# Patient Record
Sex: Male | Born: 1990
Health system: Southern US, Community
[De-identification: ages and names within clinical notes are randomized; demographics above are authoritative.]

## PROBLEM LIST (undated history)

## (undated) HISTORY — PX: COSMETIC SURGERY: SHX468

---

## 2012-12-07 ENCOUNTER — Ambulatory Visit (INDEPENDENT_AMBULATORY_CARE_PROVIDER_SITE_OTHER): Payer: BC Managed Care – PPO | Admitting: Emergency Medicine

## 2012-12-07 DIAGNOSIS — S61011A Laceration without foreign body of right thumb without damage to nail, initial encounter: Secondary | ICD-10-CM

## 2012-12-07 DIAGNOSIS — H00016 Hordeolum externum left eye, unspecified eyelid: Secondary | ICD-10-CM

## 2012-12-07 DIAGNOSIS — S61209A Unspecified open wound of unspecified finger without damage to nail, initial encounter: Secondary | ICD-10-CM

## 2012-12-07 DIAGNOSIS — H00019 Hordeolum externum unspecified eye, unspecified eyelid: Secondary | ICD-10-CM

## 2012-12-07 MED ORDER — OFLOXACIN 0.3 % OP SOLN: 1.0000 [drp] | Freq: Four times a day (QID) | OPHTHALMIC | Status: DC
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
## 2012-12-07 NOTE — Patient Instructions (Signed)
Sty A sty (hordeolum) is an infection of a gland in the eyelid located at the base of the eyelash. A sty may develop a white or yellow head of pus. It can be puffy (swollen). Usually, the sty will burst and pus will come out on its own. They do not leave lumps in the eyelid once they drain. A sty is often confused with another form of cyst of the eyelid called a chalazion. Chalazions occur within the eyelid and not on the edge where the bases of the eyelashes are. They often are red, sore and then form firm lumps in the eyelid. CAUSES   Germs (bacteria).  Lasting (chronic) eyelid inflammation. SYMPTOMS   Tenderness, redness and swelling along the edge of the eyelid at the base of the eyelashes.  Sometimes, there is a white or yellow head of pus. It may or may not drain. DIAGNOSIS  An ophthalmologist will be able to distinguish between a sty and a chalazion and treat the condition appropriately.  TREATMENT   Styes are typically treated with warm packs (compresses) until drainage occurs.  In rare cases, medicines that kill germs (antibiotics) may be prescribed. These antibiotics may be in the form of drops, cream or pills.  If a hard lump has formed, it is generally necessary to do a small incision and remove the hardened contents of the cyst in a minor surgical procedure done in the office.  In suspicious cases, your caregiver may send the contents of the cyst to the lab to be certain that it is not a rare, but dangerous form of cancer of the glands of the eyelid. HOME CARE INSTRUCTIONS   Wash your hands often and dry them with a clean towel. Avoid touching your eyelid. This may spread the infection to other parts of the eye.  Apply heat to your eyelid for 10 to 20 minutes, several times a day, to ease pain and help to heal it faster.  Do not squeeze the sty. Allow it to drain on its own. Wash your eyelid carefully 3 to 4 times per day to remove any pus. SEEK IMMEDIATE MEDICAL CARE IF:    Your eye becomes painful or puffy (swollen).  Your vision changes.  Your sty does not drain by itself within 3 days.  Your sty comes back within a short period of time, even with treatment.  You have redness (inflammation) around the eye.  You have a fever. Document Released: 10/12/2004 Document Revised: 03/27/2011 Document Reviewed: 06/16/2008 ExitCare Patient Information 2014 [employer].  

## 2013-01-25 ENCOUNTER — Ambulatory Visit (INDEPENDENT_AMBULATORY_CARE_PROVIDER_SITE_OTHER): Payer: BC Managed Care – PPO | Admitting: Physician Assistant

## 2013-01-25 VITALS — BP 110/62 | HR 86 | Temp 98.7°F | Resp 18 | Ht 70.0 in | Wt 128.0 lb

## 2013-01-25 DIAGNOSIS — R0981 Nasal congestion: Secondary | ICD-10-CM

## 2013-01-25 DIAGNOSIS — J019 Acute sinusitis, unspecified: Secondary | ICD-10-CM

## 2013-01-25 DIAGNOSIS — J3489 Other specified disorders of nose and nasal sinuses: Secondary | ICD-10-CM

## 2013-01-25 MED ORDER — AMOXICILLIN 875 MG PO TABS: 875.0000 mg | ORAL_TABLET | Freq: Two times a day (BID) | ORAL | Status: DC

## 2013-01-25 MED ORDER — IPRATROPIUM BROMIDE 0.03 % NA SOLN: 2.0000 | Freq: Two times a day (BID) | NASAL | Status: DC

## 2013-01-25 NOTE — Progress Notes (Signed)
   Subjective:    Patient ID: Adrian Johnson, male    DOB: [birth year], 23 y.o.   MRN: [id number]  HPI 23 year old male presents for evaluation of 3 day history of nasal congestion, PND, slight nonproductive cough, and bilateral ear pressure. States symptoms have been progressively worsening and today his job sent him home since he was sick.  Denies fever, chills, nausea, vomiting, sore throat, body aches, sinus pain, or dizziness. Admits that he has had mostly clear, thin rhinorrhea.  Has taken OTC Dayquil/Nyquil which has not helped much.   Patient is otherwise doing well with no other concerns today.     Review of Systems  Constitutional: Negative for fever and chills.  HENT: Positive for congestion, postnasal drip and sinus pressure. Negative for ear pain (bilateral pressure) and sore throat.   Respiratory: Positive for cough. Negative for chest tightness, shortness of breath and wheezing.   Cardiovascular: Negative for chest pain.  Gastrointestinal: Negative for nausea, vomiting and abdominal pain.  Neurological: Negative for headaches.       Objective:   Physical Exam  Constitutional: He is oriented to person, place, and time. He appears well-developed and well-nourished.  HENT:  Head: Normocephalic and atraumatic.  Right Ear: Hearing, tympanic membrane, external ear and ear canal normal.  Left Ear: Hearing, tympanic membrane, external ear and ear canal normal.  Mouth/Throat: Uvula is midline, oropharynx is clear and moist and mucous membranes are normal.  Eyes: Conjunctivae are normal.  Neck: Normal range of motion. Neck supple.  Cardiovascular: Normal rate, regular rhythm and normal heart sounds.   Pulmonary/Chest: Effort normal and breath sounds normal.  Lymphadenopathy:    He has no cervical adenopathy.  Neurological: He is alert and oriented to person, place, and time.  Psychiatric: He has a normal mood and affect. His behavior is normal. Judgment and thought content  normal.          Assessment & Plan:  Sinusitis, acute - Plan: amoxicillin (AMOXIL) 875 MG tablet  Nasal congestion - Plan: ipratropium (ATROVENT) 0.03 % nasal spray  Will go ahead and cover with amoxicillin 875 mg bid x 10 days Atrovent NS twice daily to help with ETD and congestion Increase fluids and rest Nyquil at bedtime to help with coughing.  Follow up if symptoms worsen or fail to imporve.

## 2013-04-26 ENCOUNTER — Ambulatory Visit (INDEPENDENT_AMBULATORY_CARE_PROVIDER_SITE_OTHER): Payer: BC Managed Care – PPO | Admitting: Physician Assistant

## 2013-04-26 VITALS — BP 112/60 | HR 85 | Temp 98.0°F | Resp 18 | Ht 70.5 in | Wt 132.6 lb

## 2013-04-26 DIAGNOSIS — J069 Acute upper respiratory infection, unspecified: Secondary | ICD-10-CM

## 2013-04-26 DIAGNOSIS — D72819 Decreased white blood cell count, unspecified: Secondary | ICD-10-CM

## 2013-04-26 DIAGNOSIS — R05 Cough: Secondary | ICD-10-CM

## 2013-04-26 DIAGNOSIS — J029 Acute pharyngitis, unspecified: Secondary | ICD-10-CM

## 2013-04-26 DIAGNOSIS — R059 Cough, unspecified: Secondary | ICD-10-CM

## 2013-04-26 LAB — POCT CBC
GRANULOCYTE PERCENT: 67.7 % (ref 37–80)
HCT, POC: 47.9 % (ref 43.5–53.7)
HEMOGLOBIN: 15 g/dL (ref 14.1–18.1)
Lymph, poc: 0.7 (ref 0.6–3.4)
MCH, POC: 28.4 pg (ref 27–31.2)
MCHC: 31.3 g/dL — AB (ref 31.8–35.4)
MCV: 90.6 fL (ref 80–97)
MID (CBC): 0.3 (ref 0–0.9)
MPV: 8.6 fL (ref 0–99.8)
POC Granulocyte: 2.1 (ref 2–6.9)
POC LYMPH PERCENT: 22.2 %L (ref 10–50)
POC MID %: 10.1 %M (ref 0–12)
Platelet Count, POC: 198 10*3/uL (ref 142–424)
RBC: 5.29 M/uL (ref 4.69–6.13)
RDW, POC: 15.4 %
WBC: 3.1 10*3/uL — AB (ref 4.6–10.2)

## 2013-04-26 LAB — POCT INFLUENZA A/B
INFLUENZA B, POC: NEGATIVE
Influenza A, POC: NEGATIVE

## 2013-04-26 LAB — POCT RAPID STREP A (OFFICE): RAPID STREP A SCREEN: NEGATIVE

## 2013-04-26 MED ORDER — AMOXICILLIN 500 MG PO CAPS: 500.0000 mg | ORAL_CAPSULE | Freq: Three times a day (TID) | ORAL | Status: DC

## 2013-04-26 MED ORDER — HYDROCODONE-HOMATROPINE 5-1.5 MG/5ML PO SYRP: ORAL_SOLUTION | ORAL | Status: DC

## 2013-04-26 NOTE — Progress Notes (Signed)
Subjective:    Patient ID: Adrian Johnson, maleNicholes Stairs    DOB: October 04, 1990, 23 y.o.   MRN: [id number]  HPI Primary Physician: No PCP Per Patient  Chief Complaint: ST and fever x 2-3 days  HPI: 23 y.o. male with history below presents with 2-3 day history of ST, congestion, post nasal drip, sinus pressure, sneezing, cough, headache, and fever. Cough is mildly productive of yellow sputum and worse in the morning. No SOB or wheezing. Has nasal congestion at baseline. Has a frontal sinus headache. Has some muffled hearing. States his ears feel full.   Some sick coworkers, otherwise no known sick contacts.   Of note, patient also used a Q tip to remove a tonsolith from the left side of his tonsil 2-3 days ago after trying to remove this by gargling and being unsuccessful. He states he was feeling like he was coming down with something prior to removing the tonsolith.    History reviewed. No pertinent past medical history.   Home Meds: Prior to Admission medications   Medication Sig Start Date End Date Taking? Authorizing Provider  ipratropium (ATROVENT) 0.03 % nasal spray Place 2 sprays into the nose 2 (two) times daily. 01/25/13  Yes Heather M Marte, PA-C  ofloxacin (OCUFLOX) 0.3 % ophthalmic solution Place 1 drop into the left eye 4 (four) times daily. 12/07/12  No Phillips OdorJeffery Anderson, MD    Allergies: No Known Allergies  History   Social History  . Marital Status: Single    Spouse Name: N/A    Number of Children: N/A  . Years of Education: N/A   Occupational History  . Not on file.   Social History Main Topics  . Smoking status: Never Smoker   . Smokeless tobacco: Never Used  . Alcohol Use: Yes  . Drug Use: No  . Sexual Activity: Not on file   Other Topics Concern  . Not on file   Social History Narrative  . No narrative on file     Review of Systems  Constitutional: Positive for fever and fatigue. Negative for chills and appetite change.  HENT: Positive for congestion,  hearing loss, postnasal drip, sinus pressure, sneezing and sore throat. Negative for ear pain and rhinorrhea.        Nasal congestion at baseline.  Ears feel "stuffed"  Respiratory: Positive for cough. Negative for shortness of breath and wheezing.        Cough is productive of "a little mucus" Yellow. Cough is worse in the morning.   Gastrointestinal: Negative for nausea, vomiting and diarrhea.  Neurological: Positive for headaches.       Headache along the frontal sinus.        Objective:   Physical Exam  Physical Exam: Blood pressure 112/60, pulse 85, temperature 98 F (36.7 C), temperature source Oral, resp. rate 18, height 5' 10.5" (1.791 m), weight 132 lb 9.6 oz (60.147 kg), SpO2 98.00%., Body mass index is 18.75 kg/(m^2). General: Well developed, well nourished, in no acute distress. Head: Normocephalic, atraumatic, eyes without discharge, sclera non-icteric, nares are without discharge. Bilateral auditory canals clear, TM's are without perforation, pearly grey and translucent with reflective cone of light bilaterally. Oral cavity moist, posterior pharynx erythematous with post nasal drip. Site of previous tonsolith visible. No exudate or peritonsillar abscess. Uvula midline.   Neck: Supple. No thyromegaly. Full ROM. Lymph nodes: less than 2 cm AC bilaterally. Lungs: Clear bilaterally to auscultation without wheezes, rales, or rhonchi. Breathing is unlabored. Heart: RRR  with S1 S2. No murmurs, rubs, or gallops appreciated. Msk:  Strength and tone normal for age. Extremities/Skin: Warm and dry. No clubbing or cyanosis. No edema. No rashes or suspicious lesions. Neuro: Alert and oriented X 3. Moves all extremities spontaneously. Gait is normal. CNII-XII grossly in tact. Psych:  Responds to questions appropriately with a normal affect.    Labs: Results for orders placed in visit on 04/26/13  POCT RAPID STREP A (OFFICE)      Result Value Ref Range   Rapid Strep A Screen Negative   Negative  POCT CBC      Result Value Ref Range   WBC 3.1 (*) 4.6 - 10.2 K/uL   Lymph, poc 0.7  0.6 - 3.4   POC LYMPH PERCENT 22.2  10 - 50 %L   MID (cbc) 0.3  0 - 0.9   POC MID % 10.1  0 - 12 %M   POC Granulocyte 2.1  2 - 6.9   Granulocyte percent 67.7  37 - 80 %G   RBC 5.29  4.69 - 6.13 M/uL   Hemoglobin 15.0  14.1 - 18.1 g/dL   HCT, POC 37.1  06.2 - 53.7 %   MCV 90.6  80 - 97 fL   MCH, POC 28.4  27 - 31.2 pg   MCHC 31.3 (*) 31.8 - 35.4 g/dL   RDW, POC 69.4     Platelet Count, POC 198  142 - 424 K/uL   MPV 8.6  0 - 99.8 fL  POCT INFLUENZA A/B      Result Value Ref Range   Influenza A, POC Negative     Influenza B, POC Negative      Throat culture pending     Assessment & Plan:  23 year old male with URI, pharyngitis, cough, and leukopenia  -Amoxicillin 500 mg 1 po tid #30 no RF, cover for secondary infection given the probing to remove the tonsolith at home -Hycodan #4oz 1 tsp po q 4-6 hours prn cough no RF SED -Avoid clearing tonsoliths manually in the future. Left them self resolve. Discussed the risks.  -Rest/fluids -RTC precautions   Eula Listen, MHS, PA-C [facility] [address] [place], [place] [address] [phone] [facility] 04/26/2013 1:56 PM

## 2013-04-27 ENCOUNTER — Telehealth: Payer: Self-pay

## 2013-04-27 NOTE — Telephone Encounter (Signed)
PATIENT HAS A NOTE FOR WORK FOR Saturday-Sunday AND IS NOT FEELING BETTER, WANTS TO BE WRITTEN OUT FOR Monday AS WELL.

## 2013-04-28 ENCOUNTER — Telehealth: Payer: Self-pay

## 2013-04-28 LAB — CULTURE, GROUP A STREP: Organism ID, Bacteria: NORMAL

## 2013-04-28 NOTE — Telephone Encounter (Signed)
Pt did pick up a note for school. Pt states he wont be at school tomorrow and needs a note stating ok to out tomorrow, 04/29/13 Please call pt to advise

## 2013-04-28 NOTE — Telephone Encounter (Signed)
Wrote note for today, informed patient, placed up front for p/u.  Written to go back to work tomorrow.

## 2013-04-28 NOTE — Telephone Encounter (Signed)
Is this ok?

## 2013-04-29 NOTE — Telephone Encounter (Signed)
Ok for note. If continues to feel like he cannot go to class he will have to RTC.

## 2013-04-29 NOTE — Telephone Encounter (Signed)
Note written- in pu drawer. Notified pt.

## 2013-05-09 ENCOUNTER — Ambulatory Visit (INDEPENDENT_AMBULATORY_CARE_PROVIDER_SITE_OTHER): Payer: BC Managed Care – PPO | Admitting: Family Medicine

## 2013-05-09 VITALS — BP 122/74 | HR 93 | Temp 98.2°F | Resp 17 | Ht 70.0 in | Wt 129.0 lb

## 2013-05-09 DIAGNOSIS — J029 Acute pharyngitis, unspecified: Secondary | ICD-10-CM

## 2013-05-09 LAB — POCT CBC
Granulocyte percent: 25.5 %G — AB (ref 37–80)
HEMATOCRIT: 49.7 % (ref 43.5–53.7)
HEMOGLOBIN: 15.8 g/dL (ref 14.1–18.1)
LYMPH, POC: 7.5 — AB (ref 0.6–3.4)
MCH: 28.3 pg (ref 27–31.2)
MCHC: 31.8 g/dL (ref 31.8–35.4)
MCV: 89.1 fL (ref 80–97)
MID (cbc): 1.5 — AB (ref 0–0.9)
MPV: 8.4 fL (ref 0–99.8)
POC Granulocyte: 3.1 (ref 2–6.9)
POC LYMPH PERCENT: 61.8 %L — AB (ref 10–50)
POC MID %: 12.7 %M — AB (ref 0–12)
Platelet Count, POC: 210 10*3/uL (ref 142–424)
RBC: 5.58 M/uL (ref 4.69–6.13)
RDW, POC: 14.6 %
WBC: 12.2 10*3/uL — AB (ref 4.6–10.2)

## 2013-05-09 LAB — POCT RAPID STREP A (OFFICE): Rapid Strep A Screen: NEGATIVE

## 2013-05-09 MED ORDER — FIRST-DUKES MOUTHWASH MT SUSP: 5.0000 mL | OROMUCOSAL | Status: DC | PRN

## 2013-05-09 NOTE — Patient Instructions (Signed)
I have sent in a prescription for a throat gargle. You can use it every 2 hours as needed- swish and swallow or spit Add ibuprofen 600-800 mg every 8 hours for pain Add decongestant like sudafed (generic fine) for congestion.  Pharyngitis Pharyngitis is redness, pain, and swelling (inflammation) of your pharynx.  CAUSES  Pharyngitis is usually caused by infection. Most of the time, these infections are from viruses (viral) and are part of a cold. However, sometimes pharyngitis is caused by bacteria (bacterial). Pharyngitis can also be caused by allergies. Viral pharyngitis may be spread from person to person by coughing, sneezing, and personal items or utensils (cups, forks, spoons, toothbrushes). Bacterial pharyngitis may be spread from person to person by more intimate contact, such as kissing.  SIGNS AND SYMPTOMS  Symptoms of pharyngitis include:   Sore throat.   Tiredness (fatigue).   Low-grade fever.   Headache.  Joint pain and muscle aches.  Skin rashes.  Swollen lymph nodes.  Plaque-like film on throat or tonsils (often seen with bacterial pharyngitis). DIAGNOSIS  Your health care provider will ask you questions about your illness and your symptoms. Your medical history, along with a physical exam, is often all that is needed to diagnose pharyngitis. Sometimes, a rapid strep test is done. Other lab tests may also be done, depending on the suspected cause.  TREATMENT  Viral pharyngitis will usually get better in 3 4 days without the use of medicine. Bacterial pharyngitis is treated with medicines that kill germs (antibiotics).  HOME CARE INSTRUCTIONS   Drink enough water and fluids to keep your urine clear or pale yellow.   Only take over-the-counter or prescription medicines as directed by your health care provider:   If you are prescribed antibiotics, make sure you finish them even if you start to feel better.   Do not take aspirin.   Get lots of rest.    Gargle with 8 oz of salt water ( tsp of salt per 1 qt of water) as often as every 1 2 hours to soothe your throat.   Throat lozenges (if you are not at risk for choking) or sprays may be used to soothe your throat. SEEK MEDICAL CARE IF:   You have large, tender lumps in your neck.  You have a rash.  You cough up green, yellow-brown, or bloody spit. SEEK IMMEDIATE MEDICAL CARE IF:   Your neck becomes stiff.  You drool or are unable to swallow liquids.  You vomit or are unable to keep medicines or liquids down.  You have severe pain that does not go away with the use of recommended medicines.  You have trouble breathing (not caused by a stuffy nose). MAKE SURE YOU:   Understand these instructions.  Will watch your condition.  Will get help right away if you are not doing well or get worse. Document Released: 01/02/2005 Document Revised: 10/23/2012 Document Reviewed: 09/09/2012 [facility] Patient Information 2014 [place], [place].

## 2013-05-09 NOTE — Progress Notes (Signed)
I have discussed this case with Ms. Gessner, NP and agree.  

## 2013-05-09 NOTE — Progress Notes (Signed)
Subjective:    Patient ID: Adrian Johnson, male    DOB: 27-Jun-1990, 23 y.o.   MRN: [id number]  HPI Patient seen 13 days ago by Time Warneryan Dunn,PA-C. He was given antibiotics and cough syrup. He feels that his symptoms were completely resolved until yesterday when he started to have a sore throat again. The left side of his throat is more sore than the right. He removed a tonsilith from his left tonsil prior to his last office visit. Has been taking dayquil.Took a dose today with little relief. His CBC from prior visit showed leukopenia.  He has nasal congestion.Little nasal drainage, mostly post nasal drainage. Takes atrovent nasal spray and feels this is effective for his allergy symptoms.  Review of Systems No cough, no fever, has felt hot at night for last couple of weeks. No difficulty swallowing, just painful.    Objective:   Physical Exam  Vitals reviewed. Constitutional: He is oriented to person, place, and time. He appears well-developed and well-nourished.  HENT:  Head: Normocephalic and atraumatic.  Right Ear: Tympanic membrane, external ear and ear canal normal.  Left Ear: External ear and ear canal normal.  Nose: Mucosal edema present.  Mouth/Throat: Uvula is midline. Oropharyngeal exudate, posterior oropharyngeal edema and posterior oropharyngeal erythema present.  Left TM appears thickened and opaque. Left tonsil swollen, +2, no exudate.  Eyes: Conjunctivae are normal. Right eye exhibits no discharge. Left eye exhibits no discharge.  Neck: Normal range of motion. Neck supple.  Right submandibular lymph node, approx. 1 cm (patient reports this is old). Shoddy left anterior cervical nodes.  Cardiovascular: Normal rate, regular rhythm and normal heart sounds.   Pulmonary/Chest: Effort normal and breath sounds normal.  Musculoskeletal: Normal range of motion.  Neurological: He is alert and oriented to person, place, and time.  Skin: Skin is warm.  Psychiatric: He has a  normal mood and affect. His behavior is normal. Judgment and thought content normal.   Results for orders placed in visit on 05/09/13  POCT CBC      Result Value Ref Range   WBC 12.2 (*) 4.6 - 10.2 K/uL   Lymph, poc 7.5 (*) 0.6 - 3.4   POC LYMPH PERCENT 61.8 (*) 10 - 50 %L   MID (cbc) 1.5 (*) 0 - 0.9   POC MID % 12.7 (*) 0 - 12 %M   POC Granulocyte 3.1  2 - 6.9   Granulocyte percent 25.5 (*) 37 - 80 %G   RBC 5.58  4.69 - 6.13 M/uL   Hemoglobin 15.8  14.1 - 18.1 g/dL   HCT, POC 40.949.7  81.143.5 - 53.7 %   MCV 89.1  80 - 97 fL   MCH, POC 28.3  27 - 31.2 pg   MCHC 31.8  31.8 - 35.4 g/dL   RDW, POC 91.414.6     Platelet Count, POC 210  142 - 424 K/uL   MPV 8.4  0 - 99.8 fL  POCT RAPID STREP A (OFFICE)      Result Value Ref Range   Rapid Strep A Screen Negative  Negative   Patient given Ibuprofen 600 mg po in the clinic and reports improved symptoms.     Assessment & Plan:  1. Acute pharyngitis - POCT CBC - POCT rapid strep A - Culture, Group A Strep - Diphenhyd-Hydrocort-Nystatin (FIRST-DUKES MOUTHWASH) SUSP; Use as directed 5 mLs in the mouth or throat every 2 (two) hours as needed.  Dispense: 237 mL; Refill: 0 -ibuprofen  600-800 mg po q 8 hours as needed for pain -add decongestant prn  -follow up if worsening pain, fever or difficulty swallowing or breathing.   Emi Belfasteborah B. Gessner, FNP-BC  [facility]  05/09/2013 4:26 PM

## 2013-05-10 ENCOUNTER — Telehealth: Payer: Self-pay

## 2013-05-10 NOTE — Telephone Encounter (Signed)
Is this ok?

## 2013-05-10 NOTE — Telephone Encounter (Signed)
Yes. This is ok.

## 2013-05-10 NOTE — Telephone Encounter (Signed)
Patient called wanting to know if she can get an excuse note extended for todays date as well. Please call when ready for pick up. Thank you! She was seen yesterday 04/24 by Leone PayorGessner.   Best: [phone]

## 2013-05-11 ENCOUNTER — Ambulatory Visit (INDEPENDENT_AMBULATORY_CARE_PROVIDER_SITE_OTHER): Payer: BC Managed Care – PPO | Admitting: Emergency Medicine

## 2013-05-11 VITALS — BP 100/62 | HR 88 | Temp 97.5°F | Resp 20 | Ht 69.5 in | Wt 130.4 lb

## 2013-05-11 DIAGNOSIS — J36 Peritonsillar abscess: Secondary | ICD-10-CM

## 2013-05-11 LAB — CULTURE, GROUP A STREP: ORGANISM ID, BACTERIA: NORMAL

## 2013-05-11 MED ORDER — CLINDAMYCIN HCL 150 MG PO CAPS: 150.0000 mg | ORAL_CAPSULE | Freq: Three times a day (TID) | ORAL | Status: DC

## 2013-05-11 MED ORDER — AMOXICILLIN-POT CLAVULANATE 875-125 MG PO TABS: 1.0000 | ORAL_TABLET | Freq: Two times a day (BID) | ORAL | Status: DC

## 2013-05-11 MED ORDER — ACETAMINOPHEN-CODEINE #3 300-30 MG PO TABS: 1.0000 | ORAL_TABLET | ORAL | Status: DC | PRN

## 2013-05-11 NOTE — Patient Instructions (Signed)
Peritonsillar Abscess A peritonsillar abscess is a collection of pus located in the back of the throat behind the tonsils. It usually occurs when a streptococcal infection of the throat or tonsils spreads into the space around the tonsils. They are almost always caused by the streptococcal germ (bacteria). The treatment of a peritonsillar abscess is most often drainage accomplished by putting a needle into the abscess or cutting (incising) and draining the abscess. This is most often followed with a course of antibiotics. HOME CARE INSTRUCTIONS  If your abscess was drained by your caregiver today, rinse your throat (gargle) with warm salt water four times per day or as needed for comfort. Do not swallow this mixture. Mix 1 teaspoon of salt in 8 ounces of warm water for gargling.  Rest in bed as needed. Resume activities as able.  Apply cold to your neck for pain relief. Fill a plastic bag with ice and wrap it in a towel. Hold the ice on your neck for 20 minutes 4 times per day.  Eat a soft or liquid diet as tolerated while your throat remains sore. Popsicles and ice cream may be good early choices. Drinking plenty of cold fluids will probably be soothing and help take swelling down in between the warm gargles.  Only take over-the-counter or prescription medicines for pain, discomfort, or fever as directed by your caregiver. Do not use aspirin unless directed by your physician. Aspirin slows down the clotting process. It can also cause bleeding from the drainage area if this was needled or incised today.  If antibiotics were prescribed, take them as directed for the full course of the prescription. Even if you feel you are well, you need to take them. SEEK MEDICAL CARE IF:   You have increased pain, swelling, redness, or drainage in your throat.  You develop signs of infection such as dizziness, headache, lethargy, or generalized feelings of illness.  You have difficulty breathing, swallowing or  eating.  You show signs of becoming dehydrated (lightheadedness when standing, decreased urine output, a fast heart rate, or dry mouth and mucous membranes). SEEK IMMEDIATE MEDICAL CARE IF:   You have a fever.  You are coughing up or vomiting blood.  You develop more severe throat pain uncontrolled with medicines or you start to drool.  You develop difficulty breathing, talking, or find it easier to breathe while leaning forward. Document Released: 01/02/2005 Document Revised: 03/27/2011 Document Reviewed: 08/16/2007 Exit[employer] Patient Information 2014 [employer].  

## 2013-05-11 NOTE — Progress Notes (Signed)
[facility] [address], [place] [address] [phone]  Date:  05/11/2013   Name:  Adrian StairsJulius L Schmader Johnson   DOB:  01-Mar-1990   MRN:  [id number]  PCP:  No PCP Per Patient    Chief Complaint: Sore Throat   History of Present Illness:  Adrian Johnson is a 23 y.o. very pleasant male patient who presents with the following:  Multiple visits related to his sore throat and nasal congestion.  Seen most recently 2 days ago and his throat culture is pending.  Has sore throat, nasal congestion and a mucopurulent post nasal drip.  No cough or wheezing.  No fever or chills.  No stool change or rash  Pain worse on the left side.  No improvement with over the counter medications or other home remedies. Denies other complaint or health concern today.   There are no active problems to display for this patient.   No past medical history on file.  Past Surgical History  Procedure Laterality Date  . Cosmetic surgery      History  Substance Use Topics  . Smoking status: Never Smoker   . Smokeless tobacco: Never Used  . Alcohol Use: Yes    No family history on file.  No Known Allergies  Medication list has been reviewed and updated.  Current Outpatient Prescriptions on File Prior to Visit  Medication Sig Dispense Refill  . ipratropium (ATROVENT) 0.03 % nasal spray Place 2 sprays into the nose 2 (two) times daily.  30 mL  0  . ofloxacin (OCUFLOX) 0.3 % ophthalmic solution Place 1 drop into the left eye 4 (four) times daily.  5 mL  0   No current facility-administered medications on file prior to visit.    Review of Systems:  As per HPI, otherwise negative.    Physical Examination: Filed Vitals:   05/11/13 1507  BP: 100/62  Pulse: 88  Temp: 97.5 F (36.4 C)  Resp: 20   Filed Vitals:   05/11/13 1507  Height: 5' 9.5" (1.765 m)  Weight: 130 lb 6.4 oz (59.149 kg)   Body mass index is 18.99 kg/(m^2). Ideal Body Weight: Weight in (lb) to have BMI =  25: 171.4   GEN: WDWN, NAD, Non-toxic, Alert & Oriented x 3 HEENT: Atraumatic, Normocephalic.  Erythematous posterior pharynx with what appears to be a peritonsillar abscess on the left. Ears and Nose: No external deformity.  TM negative EXTR: No clubbing/cyanosis/edema NEURO: Normal gait.  PSYCH: Normally interactive. Conversant. Not depressed or anxious appearing.  Calm demeanor.    Assessment and Plan: Peritonsillar abscess augmentin Clindamycin tyl #3   Signed,  Phillips OdorJeffery Delanna Blacketer, MD

## 2013-05-11 NOTE — Telephone Encounter (Signed)
LMOM that note is up front for p/u

## 2013-06-30 ENCOUNTER — Ambulatory Visit (INDEPENDENT_AMBULATORY_CARE_PROVIDER_SITE_OTHER): Payer: BC Managed Care – PPO | Admitting: Emergency Medicine

## 2013-06-30 VITALS — BP 100/72 | HR 74 | Temp 98.3°F | Resp 18 | Ht 70.0 in | Wt 131.0 lb

## 2013-06-30 DIAGNOSIS — J029 Acute pharyngitis, unspecified: Secondary | ICD-10-CM

## 2013-06-30 MED ORDER — PENICILLIN V POTASSIUM 500 MG PO TABS: 500.0000 mg | ORAL_TABLET | Freq: Four times a day (QID) | ORAL | Status: DC

## 2013-06-30 NOTE — Patient Instructions (Signed)

## 2013-06-30 NOTE — Progress Notes (Signed)
[facility] [address], [place] [address] [phone]  Date:  06/30/2013   Name:  Adrian Johnson   DOB:  1990/04/13   MRN:  [id number]  PCP:  No PCP Per Patient    Chief Complaint: Sore Throat   History of Present Illness:  Adrian Johnson is a 23 y.o. very pleasant male patient who presents with the following:  Has dysphagia and sore throat past several days.  No fever or chills.  Some post nasal clear drainage.  History of recurrent tonsillitis.  No cough, wheezing or shortness of breath. No nausea or vomiting. No rash.  No stool change.  No improvement with over the counter medications or other home remedies. Denies other complaint or health concern today.    There are no active problems to display for this patient.   History reviewed. No pertinent past medical history.  Past Surgical History  Procedure Laterality Date  . Cosmetic surgery      History  Substance Use Topics  . Smoking status: Never Smoker   . Smokeless tobacco: Never Used  . Alcohol Use: Yes    History reviewed. No pertinent family history.  No Known Allergies  Medication list has been reviewed and updated.  Current Outpatient Prescriptions on File Prior to Visit  Medication Sig Dispense Refill  . ipratropium (ATROVENT) 0.03 % nasal spray Place 2 sprays into the nose 2 (two) times daily.  30 mL  0  . ofloxacin (OCUFLOX) 0.3 % ophthalmic solution Place 1 drop into the left eye 4 (four) times daily.  5 mL  0  . acetaminophen-codeine (TYLENOL #3) 300-30 MG per tablet Take 1-2 tablets by mouth every 4 (four) hours as needed.  30 tablet  0  . amoxicillin-clavulanate (AUGMENTIN) 875-125 MG per tablet Take 1 tablet by mouth 2 (two) times daily.  20 tablet  0  . clindamycin (CLEOCIN) 150 MG capsule Take 1 capsule (150 mg total) by mouth 3 (three) times daily.  30 capsule  0   No current facility-administered medications on file prior to visit.    Review of Systems:  As per  HPI, otherwise negative.    Physical Examination: Filed Vitals:   06/30/13 1421  BP: 100/72  Pulse: 74  Temp: 98.3 F (36.8 C)  Resp: 18   Filed Vitals:   06/30/13 1421  Height: 5\' 10"  (1.778 m)  Weight: 131 lb (59.421 kg)   Body mass index is 18.8 kg/(m^2). Ideal Body Weight: Weight in (lb) to have BMI = 25: 173.9  GEN: WDWN, NAD, Non-toxic, A & O x 3 HEENT: Atraumatic, Normocephalic. Neck supple. No masses, No LAD.  Tonsils enlarged and erythematous.  Some exudate.  Tongue coated.  Aphthous ulcers on oral mucosa Ears and Nose: No external deformity. CV: RRR, No M/G/R. No JVD. No thrill. No extra heart sounds. PULM: CTA B, no wheezes, crackles, rhonchi. No retractions. No resp. distress. No accessory muscle use. ABD: S, NT, ND, +BS. No rebound. No HSM. EXTR: No c/c/e NEURO Normal gait.  PSYCH: Normally interactive. Conversant. Not depressed or anxious appearing.  Calm demeanor.    Assessment and Plan: Tonsillitis Aphthous ulcers  Signed,  Phillips OdorJeffery Camry Theiss, MD

## 2013-08-06 ENCOUNTER — Ambulatory Visit (INDEPENDENT_AMBULATORY_CARE_PROVIDER_SITE_OTHER): Payer: BC Managed Care – PPO | Admitting: Family Medicine

## 2013-08-06 VITALS — BP 120/70 | HR 73 | Temp 98.1°F | Ht 70.0 in | Wt 128.0 lb

## 2013-08-06 DIAGNOSIS — M79609 Pain in unspecified limb: Secondary | ICD-10-CM

## 2013-08-06 DIAGNOSIS — J029 Acute pharyngitis, unspecified: Secondary | ICD-10-CM

## 2013-08-06 DIAGNOSIS — M79606 Pain in leg, unspecified: Secondary | ICD-10-CM

## 2013-08-06 DIAGNOSIS — IMO0002 Reserved for concepts with insufficient information to code with codable children: Secondary | ICD-10-CM

## 2013-08-06 DIAGNOSIS — S86899A Other injury of other muscle(s) and tendon(s) at lower leg level, unspecified leg, initial encounter: Secondary | ICD-10-CM

## 2013-08-06 MED ORDER — MELOXICAM 7.5 MG PO TABS: 7.5000 mg | ORAL_TABLET | Freq: Every day | ORAL | Status: DC

## 2013-08-06 MED ORDER — PENICILLIN V POTASSIUM 500 MG PO TABS: 500.0000 mg | ORAL_TABLET | Freq: Three times a day (TID) | ORAL | Status: DC

## 2013-08-06 NOTE — Progress Notes (Signed)
This chart was scribed for Elvina SidleKurt Salley Boxley, MD by Luisa DagoPriscilla Tutu, ED Scribe. This patient was seen in room 14 and the patient's care was started at 8:13 PM.  Patient ID: Adrian Johnson MRN: [id number], DOB: July 14, 1990, 23 y.o. Date of Encounter: 08/06/2013, 8:12 PM  Primary Physician: No PCP Per Patient  Chief Complaint:  Chief Complaint  Patient presents with  . 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 Pt states that he has been sick for the past week, but he started to feel better until yesterday. Secondary to the sore throat he was not able to go to work. He is requesting a note for work. Pt states that he was prescribed Penicillin, but it has not been able to get rid of his throat symptoms. He denies any fever, chills, nausea, or emesis.   Pt states that a month ago he went on vacation, where he did a lot of walking. Following some time at home he noticed mild pain to his left foot. Denies any other pertinent medical history.   No past medical history on file.   Home Meds: Prior to Admission medications   Medication Sig Start Date End Date Taking? Authorizing Provider  ipratropium (ATROVENT) 0.03 % nasal spray Place 2 sprays into the nose 2 (two) times daily. 01/25/13  Yes Nelva NayHeather M Marte, PA-C    Allergies: No Known Allergies  History   Social History  . Marital Status: Single    Spouse Name: N/A    Number of Children: N/A  . Years of Education: N/A   Occupational History  . Not on file.   Social History Main Topics  . Smoking status: Never Smoker   . Smokeless tobacco: Never Used  . Alcohol Use: Yes  . Drug Use: No  . Sexual Activity: Not on file   Other Topics Concern  . Not on file   Social History Narrative  . No narrative on file     Review of Systems: positive for sore throat and  cough Constitutional: negative for chills, fever, night sweats, weight changes, or fatigue  HEENT: negative for vision changes, hearing loss, congestion, rhinorrhea, ST, epistaxis, or sinus pressure Cardiovascular: negative for chest pain or palpitations Respiratory: negative for hemoptysis, wheezing, shortness of breath, or cough Abdominal: negative for abdominal pain, nausea, vomiting, diarrhea, or constipation Dermatological: negative for rash Neurologic: negative for headache, dizziness, or syncope All other systems reviewed and are otherwise negative with the exception to those above and in the HPI.   Physical Exam: Blood pressure 120/70, pulse 73, temperature 98.1 F (36.7 C), temperature source Oral, height 5\' 10"  (1.778 m), weight 128 lb (58.06 kg), SpO2 99.00%., Body mass index is 18.37 kg/(m^2). General: Well developed, well nourished, in no acute distress. Head: Normocephalic, atraumatic, eyes without discharge, sclera non-icteric, nares are without discharge. Bilateral auditory canals clear, TM's are without perforation, pearly grey and translucent with reflective cone of light bilaterally. Oral cavity moist, posterior pharynx without exudate, erythema, peritonsillar abscess, or post nasal drip. Throat: is very red. tongue is coated with a yellow film. Mildly swollen cervical lymph nodes.  Neck: Supple. No thyromegaly. Full ROM. No lymphadenopathy. Lungs: Clear bilaterally to auscultation without wheezes, rales, or rhonchi. Breathing is unlabored. Heart: RRR with S1 S2. No murmurs, rubs, or gallops appreciated. Abdomen: Soft, non-tender,  non-distended with normoactive bowel sounds. No hepatomegaly. No rebound/guarding. No obvious abdominal masses. Msk:  Strength and tone normal for age. Extremities/Skin: Warm and dry. No clubbing or cyanosis. No edema. No rashes or suspicious lesions. Neuro: Alert and oriented X 3. Moves all extremities spontaneously. Gait is normal. CNII-XII  grossly in tact. Psych:  Responds to questions appropriately with a normal affect.      ASSESSMENT AND PLAN:  23 y.o. year old male with Shin splints, unspecified laterality, initial encounter - Plan: meloxicam (MOBIC) 7.5 MG tablet  Acute pharyngitis, unspecified pharyngitis type - Plan: penicillin v potassium (VEETID) 500 MG tablet, meloxicam (MOBIC) 7.5 MG tablet  Pain of lower extremity, unspecified laterality - Plan: meloxicam (MOBIC) 7.5 MG tablet      I personally performed the services described in this documentation, which was scribed in my presence. The recorded information has been reviewed and is accurate.  Signed, Elvina Sidle, MD 08/06/2013 8:12 PM

## 2013-08-06 NOTE — Progress Notes (Signed)
Called in.

## 2013-09-16 ENCOUNTER — Ambulatory Visit (INDEPENDENT_AMBULATORY_CARE_PROVIDER_SITE_OTHER): Payer: BC Managed Care – PPO | Admitting: Physician Assistant

## 2013-09-16 VITALS — BP 120/60 | HR 83 | Temp 97.8°F | Resp 16 | Ht 70.0 in | Wt 131.6 lb

## 2013-09-16 DIAGNOSIS — Z23 Encounter for immunization: Secondary | ICD-10-CM

## 2013-09-16 NOTE — Progress Notes (Signed)
   Subjective:    Patient ID: KASHAUN BEBO, male    DOB: Dec 24, 1990, 23 y.o.   MRN: [id number]  HPI 23 year old male presents for immunization review. He will be starting at [employer] and needs documentation of a second MMR vaccine.  States he has documentation of 2 but his PCP would not sign off on his second dose because it was given elsewhere.  UTD on all other vaccines. No other concerns today.     Review of Systems  Respiratory: Negative for cough.   Gastrointestinal: Negative for nausea and vomiting.  Neurological: Negative for dizziness and headaches.       Objective:   Physical Exam  Constitutional: He is oriented to person, place, and time. He appears well-developed and well-nourished.  HENT:  Head: Normocephalic and atraumatic.  Right Ear: External ear normal.  Left Ear: External ear normal.  Eyes: Conjunctivae are normal.  Neck: Normal range of motion.  Cardiovascular: Normal rate.   Neurological: He is alert and oriented to person, place, and time.  Psychiatric: He has a normal mood and affect. His behavior is normal. Judgment and thought content normal.          Assessment & Plan:  Need for MMR vaccine - Plan: MMR vaccine subcutaneous  MMR #2 given today Follow up as needed.

## 2013-12-31 ENCOUNTER — Ambulatory Visit (INDEPENDENT_AMBULATORY_CARE_PROVIDER_SITE_OTHER): Payer: BC Managed Care – PPO

## 2013-12-31 ENCOUNTER — Ambulatory Visit (INDEPENDENT_AMBULATORY_CARE_PROVIDER_SITE_OTHER): Payer: BC Managed Care – PPO | Admitting: Emergency Medicine

## 2013-12-31 VITALS — BP 114/54 | HR 108 | Temp 98.5°F | Resp 20 | Ht 70.0 in | Wt 133.0 lb

## 2013-12-31 DIAGNOSIS — R109 Unspecified abdominal pain: Secondary | ICD-10-CM

## 2013-12-31 DIAGNOSIS — Z202 Contact with and (suspected) exposure to infections with a predominantly sexual mode of transmission: Secondary | ICD-10-CM

## 2013-12-31 LAB — POCT CBC
GRANULOCYTE PERCENT: 73.1 % (ref 37–80)
HCT, POC: 48.1 % (ref 43.5–53.7)
HEMOGLOBIN: 15.3 g/dL (ref 14.1–18.1)
Lymph, poc: 2.1 (ref 0.6–3.4)
MCH, POC: 27.8 pg (ref 27–31.2)
MCHC: 31.9 g/dL (ref 31.8–35.4)
MCV: 87.2 fL (ref 80–97)
MID (cbc): 0.4 (ref 0–0.9)
MPV: 6.9 fL (ref 0–99.8)
PLATELET COUNT, POC: 255 10*3/uL (ref 142–424)
POC Granulocyte: 6.8 (ref 2–6.9)
POC LYMPH PERCENT: 22.2 %L (ref 10–50)
POC MID %: 4.7 %M (ref 0–12)
RBC: 5.52 M/uL (ref 4.69–6.13)
RDW, POC: 14.5 %
WBC: 9.3 10*3/uL (ref 4.6–10.2)

## 2013-12-31 MED ORDER — POLYETHYLENE GLYCOL 3350 17 GM/SCOOP PO POWD: 17.0000 g | Freq: Once | ORAL | Status: DC

## 2013-12-31 NOTE — Progress Notes (Signed)
[facility] [address], [place] [address] [phone]  Date:  12/31/2013   Name:  Adrian StairsJulius L Blowe   DOB:  01-28-1990   MRN:  [id number]  PCP:  No PCP Per Patient    Chief Complaint: Constipation and Exposure to STD   History of Present Illness:  Adrian StairsJulius L Batta is a 23 y.o. very pleasant male patient who presents with the following:  Ill with cramping abdominal pain for the past 4 days that waxes and wanes.  "poor" diet.   The patient has no complaint of blood, mucous, or pus in her stools. No nausea or vomiting.  Denies constipation or watery stools. No fever or chills.  No rash No cough or coryza Request STD check. No improvement with over the counter medications or other home remedies. . Denies other complaint or health concern today.   There are no active problems to display for this patient.   No past medical history on file.  Past Surgical History  Procedure Laterality Date  . Cosmetic surgery      History  Substance Use Topics  . Smoking status: Never Smoker   . Smokeless tobacco: Never Used  . Alcohol Use: Yes    No family history on file.  No Known Allergies  Medication list has been reviewed and updated.  Current Outpatient Prescriptions on File Prior to Visit  Medication Sig Dispense Refill  . ipratropium (ATROVENT) 0.03 % nasal spray Place 2 sprays into the nose 2 (two) times daily. 30 mL 0   No current facility-administered medications on file prior to visit.    Review of Systems:  As per HPI, otherwise negative.    Physical Examination: Filed Vitals:   12/31/13 1509  BP: 114/54  Pulse: 108  Temp: 98.5 F (36.9 C)  Resp: 20   Filed Vitals:   12/31/13 1509  Height: 5\' 10"  (1.778 m)  Weight: 133 lb (60.328 kg)   Body mass index is 19.08 kg/(m^2). Ideal Body Weight: Weight in (lb) to have BMI = 25: 173.9  GEN: WDWN, NAD, Non-toxic, A & O x 3 HEENT: Atraumatic, Normocephalic. Neck supple. No masses, No  LAD. Ears and Nose: No external deformity. CV: RRR, No M/G/R. No JVD. No thrill. No extra heart sounds. PULM: CTA B, no wheezes, crackles, rhonchi. No retractions. No resp. distress. No accessory muscle use. ABD: S, NT, ND, +BS. No rebound. No HSM. EXTR: No c/c/e NEURO Normal gait.  PSYCH: Normally interactive. Conversant. Not depressed or anxious appearing.  Calm demeanor.    Assessment and Plan: Abdominal pain miralax  Signed,  Phillips OdorJeffery Oveta Idris, MD   Results for orders placed or performed in visit on 12/31/13  POCT CBC  Result Value Ref Range   WBC 9.3 4.6 - 10.2 K/uL   Lymph, poc 2.1 0.6 - 3.4   POC LYMPH PERCENT 22.2 10 - 50 %L   MID (cbc) 0.4 0 - 0.9   POC MID % 4.7 0 - 12 %M   POC Granulocyte 6.8 2 - 6.9   Granulocyte percent 73.1 37 - 80 %G   RBC 5.52 4.69 - 6.13 M/uL   Hemoglobin 15.3 14.1 - 18.1 g/dL   HCT, POC 65.748.1 84.643.5 - 53.7 %   MCV 87.2 80 - 97 fL   MCH, POC 27.8 27 - 31.2 pg   MCHC 31.9 31.8 - 35.4 g/dL   RDW, POC 96.214.5 %   Platelet Count, POC 255 142 - 424 K/uL  MPV 6.9 0 - 99.8 fL   [facility] reading (PRIMARY) by  Dr. Dareen PianoAnderson.  negative.

## 2013-12-31 NOTE — Patient Instructions (Signed)

## 2014-01-01 LAB — ACUTE HEP PANEL AND HEP B SURFACE AB
HCV AB: NEGATIVE
HEP A IGM: NONREACTIVE
Hep B C IgM: NONREACTIVE
Hep B S Ab: NEGATIVE
Hepatitis B Surface Ag: NEGATIVE

## 2014-01-01 LAB — RPR

## 2014-01-01 LAB — GC/CHLAMYDIA PROBE AMP
CT PROBE, AMP APTIMA: NEGATIVE
GC Probe RNA: NEGATIVE

## 2014-01-01 LAB — HIV ANTIBODY (ROUTINE TESTING W REFLEX): HIV 1&2 Ab, 4th Generation: NONREACTIVE

## 2014-12-17 ENCOUNTER — Ambulatory Visit (INDEPENDENT_AMBULATORY_CARE_PROVIDER_SITE_OTHER): Payer: BLUE CROSS/BLUE SHIELD | Admitting: Family Medicine

## 2014-12-17 ENCOUNTER — Encounter: Payer: Self-pay | Admitting: *Deleted

## 2014-12-17 VITALS — BP 128/68 | HR 64 | Temp 97.3°F | Resp 16 | Ht 71.0 in | Wt 144.0 lb

## 2014-12-17 DIAGNOSIS — Z202 Contact with and (suspected) exposure to infections with a predominantly sexual mode of transmission: Secondary | ICD-10-CM | POA: Diagnosis not present

## 2014-12-17 LAB — POCT URINALYSIS DIP (MANUAL ENTRY)
Bilirubin, UA: NEGATIVE
Blood, UA: NEGATIVE
Glucose, UA: NEGATIVE
Ketones, POC UA: NEGATIVE
Leukocytes, UA: NEGATIVE
Nitrite, UA: NEGATIVE
Protein Ur, POC: NEGATIVE
Spec Grav, UA: 1.015
Urobilinogen, UA: 0.2
pH, UA: 7.5

## 2014-12-17 LAB — POC MICROSCOPIC URINALYSIS (UMFC): Mucus: ABSENT

## 2014-12-17 NOTE — Progress Notes (Signed)
This chart was scribed for Elvina SidleKurt Lauenstein, MD by Stann Oresung-Kai Tsai, medical scribe at [facility].The patient was seen in exam room 13 and the patient's care was started at 5:21 PM.  Patient ID: Adrian Johnson MRN: [id number], DOB: 1990-03-28, 24 y.o. Date of Encounter: 12/17/2014  Primary Physician: No PCP Per Patient  Chief Complaint:  Chief Complaint  Patient presents with   labwork    pt. wants the full std check     HPI:  Adrian Johnson is a 24 y.o. male who [facility]. He wants a full STD check. He states that he's had several partners in the last year. He denies any symptoms.   Patient works for [employer]  History reviewed. No pertinent past medical history.   Home Meds: Prior to Admission medications   Medication Sig Start Date End Date Taking? Authorizing Provider  ipratropium (ATROVENT) 0.03 % nasal spray Place 2 sprays into the nose 2 (two) times daily. 01/25/13  Yes Nelva NayHeather M Marte, PA-C    Allergies: No Known Allergies  Social History   Social History   Marital Status: Single    Spouse Name: N/A   Number of Children: N/A   Years of Education: N/A   Occupational History   Not on file.   Social History Main Topics   Smoking status: Never Smoker    Smokeless tobacco: Never Used   Alcohol Use: Yes   Drug Use: No   Sexual Activity: Not on file   Other Topics Concern   Not on file   Social History Narrative     Review of Systems: Constitutional: negative for fever, chills, night sweats, weight changes, or fatigue  HEENT: negative for vision changes, hearing loss, congestion, rhinorrhea, ST, epistaxis, or sinus pressure Cardiovascular: negative for chest pain or palpitations Respiratory: negative for hemoptysis, wheezing, shortness of breath, or cough Abdominal: negative for abdominal pain, nausea, vomiting, diarrhea, or constipation Dermatological: negative for  rash Neurologic: negative for headache, dizziness, or syncope All other systems reviewed and are otherwise negative with the exception to those above and in the HPI.  Physical Exam: Blood pressure 128/68, pulse 64, temperature 97.3 F (36.3 C), temperature source Oral, resp. rate 16, height 5\' 11"  (1.803 m), weight 144 lb (65.318 kg), SpO2 98 %., Body mass index is 20.09 kg/(m^2). General: Well developed, well nourished, in no acute distress. Head: Normocephalic, atraumatic, eyes without discharge, sclera non-icteric, nares are without discharge. Bilateral auditory canals clear, TM's are without perforation, pearly grey and translucent with reflective cone of light bilaterally. Oral cavity moist, posterior pharynx without exudate, erythema, peritonsillar abscess, or post nasal drip.  Neck: Supple. No thyromegaly. Full ROM. No lymphadenopathy. Lungs: Clear bilaterally to auscultation without wheezes, rales, or rhonchi. Breathing is unlabored. Heart: RRR with S1 S2. No murmurs, rubs, or gallops appreciated. Abdomen: Soft, non-tender, non-distended with normoactive bowel sounds. No hepatomegaly. No rebound/guarding. No obvious abdominal masses. Msk:  Strength and tone normal for age. Extremities/Skin: Warm and dry. No clubbing or cyanosis. No edema. No rashes or suspicious lesions. Neuro: Alert and oriented X 3. Moves all extremities spontaneously. Gait is normal. CNII-XII grossly in tact. Psych:  Responds to questions appropriately with a normal affect. GU: normal genitalia     ASSESSMENT AND PLAN:  24 y.o. year old male with  This chart was scribed in my presence and reviewed by me personally.    ICD-9-CM ICD-10-CM   1.  STD exposure V01.6 Z20.2 POCT urinalysis dipstick     POCT Microscopic Urinalysis ([facility])     RPR     HIV antibody     GC/Chlamydia Probe Amp     Signed, Elvina Sidle, MD    By signing my name below, I, Stann Ore, attest that this documentation has been  prepared under the direction and in the presence of Elvina Sidle, MD. Electronically Signed: Stann Ore, Scribe. 12/17/2014 , 5:21 PM .  Signed, Elvina Sidle, MD 12/17/2014 5:21 PM

## 2014-12-18 LAB — GC/CHLAMYDIA PROBE AMP
CT Probe RNA: NEGATIVE
GC Probe RNA: NEGATIVE

## 2014-12-18 LAB — RPR

## 2014-12-19 ENCOUNTER — Encounter: Payer: Self-pay | Admitting: Family Medicine

## 2014-12-21 LAB — HIV ANTIBODY (ROUTINE TESTING W REFLEX): HIV 1&2 Ab, 4th Generation: NONREACTIVE

## 2015-09-23 ENCOUNTER — Ambulatory Visit (INDEPENDENT_AMBULATORY_CARE_PROVIDER_SITE_OTHER): Payer: BLUE CROSS/BLUE SHIELD | Admitting: Family Medicine

## 2015-09-23 VITALS — BP 124/76 | HR 95 | Temp 98.2°F | Resp 17 | Ht 71.0 in | Wt 151.0 lb

## 2015-09-23 DIAGNOSIS — Z113 Encounter for screening for infections with a predominantly sexual mode of transmission: Secondary | ICD-10-CM

## 2015-09-23 DIAGNOSIS — J3489 Other specified disorders of nose and nasal sinuses: Secondary | ICD-10-CM | POA: Diagnosis not present

## 2015-09-23 DIAGNOSIS — Z8709 Personal history of other diseases of the respiratory system: Secondary | ICD-10-CM

## 2015-09-23 DIAGNOSIS — J020 Streptococcal pharyngitis: Secondary | ICD-10-CM | POA: Diagnosis not present

## 2015-09-23 LAB — POCT RAPID STREP A (OFFICE): Rapid Strep A Screen: POSITIVE — AB

## 2015-09-23 MED ORDER — FLUTICASONE PROPIONATE 50 MCG/ACT NA SUSP: 2.0000 | Freq: Every day | NASAL | 6 refills | Status: DC

## 2015-09-23 MED ORDER — AMOXICILLIN 875 MG PO TABS: 875.0000 mg | ORAL_TABLET | Freq: Two times a day (BID) | ORAL | 0 refills | Status: DC

## 2015-09-23 NOTE — Progress Notes (Signed)
   Adrian Johnson is a 25 y.o. male who [facility] today for sore throat:  1.  Sore throat: Present for the past 24 hours. His roommate was recently diagnosed with strep throat. He has recurrent history of strep throat. States this feels like that. He also has history of tonsil stones and has noted white on his tonsils as unsure whether this is from strep or from stone. He is eating and drinking well. Pain is worse when he attempts to swallow. He does have minor runny nose as well. He tells me today he has been using Afrin almost daily for the past several months if not longer.  2.  STD testing: Patient related to have STD testing. He like to be checked for hepatitis, HIV, gonorrhea and chlamydia. He has no history of STDs. He denies any current symptoms. No penile discharge. No penile pain. No fevers or chills. No weight loss.  ROS as above.     PMH reviewed. Patient is a nonsmoker.   No past medical history on file. Past Surgical History:  Procedure Laterality Date  . COSMETIC SURGERY      Medications reviewed. Current Outpatient Prescriptions  Medication Sig Dispense Refill  . ibuprofen (ADVIL,MOTRIN) 100 MG tablet Take 100 mg by mouth every 6 (six) hours as needed for fever.    Marland Kitchen. oxymetazoline (AFRIN) 0.05 % nasal spray Place 1 spray into both nostrils 2 (two) times daily.     No current facility-administered medications for this visit.      Physical Exam:  BP 124/76 (BP Location: Right Arm, Patient Position: Sitting, Cuff Size: Normal)   Pulse 95   Temp 98.2 F (36.8 C) (Oral)   Resp 17   Ht 5\' 11"  (1.803 m)   Wt 151 lb (68.5 kg)   SpO2 99%   BMI 21.06 kg/m  Gen:  Patient sitting on exam table, appears stated age in no acute distress Head: Normocephalic atraumatic Eyes: EOMI, PERRL, sclera and conjunctiva non-erythematous Ears:  Canals clear bilaterally.  TMs pearly gray bilaterally without erythema or bulging.   Nose:  Nasal turbinates  grossly enlarged bilaterally. Some exudates noted. Tender to palpation of maxillary sinus  Mouth: Mucosa membranes moist. Tonsils +2, nonenlarged, non-erythematous. Neck: No cervical lymphadenopathy noted Heart:  RRR, no murmurs auscultated. Pulm:  Clear to auscultation bilaterally with good air movement.  No wheezes or rales noted.    Results for orders placed or performed in visit on 09/23/15  POCT rapid strep A  Result Value Ref Range   Rapid Strep A Screen Positive (A) Negative     Assessment and Plan:  1.  Strep throat: - positive test - treat with amoxicillin.   - he tells me as he's leaving that his roommate also tested postive.    2.  Deviated septum:   - I am unable to appreciate this on exam today.  He tells me that several doctors in the past have told him about this. They have recommended he be referred to ENT for discussion on whether he needs to be corrected. Evidently he does also have a history of recurrent sinusitis. Due to the history of recurrent sinusitis and patient preference we'll refer today to ENT.  3.  Afrin overuse: - switch to Flonase.  - stop Afrin  4. STD checking: -Checking labs today and will call patient with results.

## 2015-09-23 NOTE — Patient Instructions (Addendum)
I have sent in the amoxicillin for you.  The strep screen was negative.    We will call and let you know the results of your lab tests.   It was good to see you today!    IF you received an x-ray today, you will receive an invoice from [facility]. Please contact [facility] at [phone] with questions or concerns regarding your invoice.   IF you received labwork today, you will receive an invoice from [facility]. Please contact [facility] at [phone] with questions or concerns regarding your invoice.   Our billing staff will not be able to assist you with questions regarding bills from these companies.  You will be contacted with the lab results as soon as they are available. The fastest way to get your results is to activate your My Chart account. Instructions are located on the last page of this paperwork. If you have not heard from us regarding the results in 2 weeks, please contact this office.

## 2015-09-24 LAB — ACUTE HEP PANEL AND HEP B SURFACE AB
HCV AB: NEGATIVE
HEP A IGM: NONREACTIVE
Hep B C IgM: NONREACTIVE
Hep B S Ab: NEGATIVE
Hepatitis B Surface Ag: NEGATIVE

## 2015-09-24 LAB — HIV ANTIBODY (ROUTINE TESTING W REFLEX): HIV: NONREACTIVE

## 2015-09-25 LAB — GC/CHLAMYDIA PROBE AMP
CT Probe RNA: NOT DETECTED
GC PROBE AMP APTIMA: NOT DETECTED

## 2015-09-27 ENCOUNTER — Telehealth: Payer: Self-pay

## 2015-09-27 NOTE — Telephone Encounter (Signed)
Patient states that he was told he would have his lab results within a day or two and hasn't received a phone call. Please advise!

## 2015-09-28 NOTE — Telephone Encounter (Signed)
Pt advised labs negative/normal.

## 2015-11-11 ENCOUNTER — Ambulatory Visit (INDEPENDENT_AMBULATORY_CARE_PROVIDER_SITE_OTHER): Payer: BLUE CROSS/BLUE SHIELD | Admitting: Family Medicine

## 2015-11-11 VITALS — BP 110/74 | HR 72 | Temp 98.0°F | Resp 18 | Ht 71.0 in | Wt 149.4 lb

## 2015-11-11 DIAGNOSIS — Z113 Encounter for screening for infections with a predominantly sexual mode of transmission: Secondary | ICD-10-CM

## 2015-11-11 DIAGNOSIS — Z23 Encounter for immunization: Secondary | ICD-10-CM

## 2015-11-11 NOTE — Progress Notes (Signed)
   Adrian StairsJulius L Johnson is a 25 y.o. male who presents to [facility] today for STD screen:  1.  STD screen:  Patient has no boyfriend for past month and next to be checked whenever he has a new sexual partner. He is currently monogamous. He has no symptoms. No genital sores or lesions, anal adenopathy, sore throat, cervical adenopathy. No fevers or chills nor abdominal pain.  No penile pain, dysuria, discharge.    Patient also desires Gardasil today.  He is a male who has sex with men.   ROS as above.     PMH reviewed. Patient is a nonsmoker.   No past medical history on file. Past Surgical History:  Procedure Laterality Date  . COSMETIC SURGERY      Medications reviewed. Current Outpatient Prescriptions  Medication Sig Dispense Refill  . fluticasone (FLONASE) 50 MCG/ACT nasal spray Place 2 sprays into both nostrils daily. 16 g 6  . ibuprofen (ADVIL,MOTRIN) 100 MG tablet Take 100 mg by mouth every 6 (six) hours as needed for fever.    Marland Kitchen. oxymetazoline (AFRIN) 0.05 % nasal spray Place 1 spray into both nostrils 2 (two) times daily.     No current facility-administered medications for this visit.      Physical Exam:  BP 110/74   Pulse 72   Temp 98 F (36.7 C) (Oral)   Resp 18   Ht 5\' 11"  (1.803 m)   Wt 149 lb 6.4 oz (67.8 kg)   SpO2 99%   BMI 20.84 kg/m  Gen:  Alert, cooperative patient who appears stated age in no acute distress.  Vital signs reviewed. HEENT: EOMI,  MMM.  No tonsillar hypertrophy or exudates.  Neck:  No cervical LAD Pulm:  Clear to auscultation bilaterally  Cardiac:  Regular rate and rhythm  Abd:  Soft/nondistended/nontender.  No masses GU:  Deferred today, no symptoms.  Ext:  No edema.  Assessment and Plan:  1.  STD screening - GC/Chlamydia, hepatitis, HIV, RPR. -We'll alert patient results to my chart.  #2. HPV vaccination: -First dose of Gardasil today. Follow-up in 1-2 months for second dose. Last dose 5-6 months after that. -  he is male for who has sex with men and therefore falls within the age range prior to age 25.

## 2015-11-11 NOTE — Patient Instructions (Addendum)
You do NOT have to come back for a separate doctor visit for each Gardasil.  Just make an immunization visit.  The immunizatoin schedule is as follows: the vaccine is given once, followed by another 1 to 2 months later, and the final at 6 months.  We'll communicate through [place] your lab results.    It was good to see you again today!  Human Papillomavirus Quadrivalent Vaccine suspension for injection What is this medicine? HUMAN PAPILLOMAVIRUS VACCINE (HYOO muhn pap uh LOH muh vahy ruhs vak SEEN) is a vaccine. It is used to prevent infections of four types of the human papillomavirus. In women, the vaccine may lower your risk of getting cervical, vaginal, vulvar, or anal cancer and genital warts. In men, the vaccine may lower your risk of getting genital warts and anal cancer. You cannot get these diseases from the vaccine. This vaccine does not treat these diseases. This medicine may be used for other purposes; ask your health care provider or pharmacist if you have questions. What should I tell my health care provider before I take this medicine? They need to know if you have any of these conditions: -fever or infection -hemophilia -HIV infection or AIDS -immune system problems -low platelet count -an unusual reaction to Human Papillomavirus Vaccine, yeast, other medicines, foods, dyes, or preservatives -pregnant or trying to get pregnant -breast-feeding How should I use this medicine? This vaccine is for injection in a muscle on your upper arm or thigh. It is given by a health care professional. Dennis Bast will be observed for 15 minutes after each dose. Sometimes, fainting happens after the vaccine is given. You may be asked to sit or lie down during the 15 minutes.  A copy of a Vaccine Information Statement will be given before each vaccination. Read this sheet carefully each time. The sheet may change frequently. Talk to your pediatrician regarding the use of this medicine in children.  While this drug may be prescribed for children as young as 49 years of age for selected conditions, precautions do apply. Overdosage: If you think you have taken too much of this medicine contact a poison control center or emergency room at once. NOTE: This medicine is only for you. Do not share this medicine with others. What if I miss a dose? All 3 doses of the vaccine should be given within 6 months. Remember to keep appointments for follow-up doses. Your health care provider will tell you when to return for the next vaccine. Ask your health care professional for advice if you are unable to keep an appointment or miss a scheduled dose. What may interact with this medicine? -other vaccines This list may not describe all possible interactions. Give your health care provider a list of all the medicines, herbs, non-prescription drugs, or dietary supplements you use. Also tell them if you smoke, drink alcohol, or use illegal drugs. Some items may interact with your medicine. What should I watch for while using this medicine? This vaccine may not fully protect everyone. Continue to have regular pelvic exams and cervical or anal cancer screenings as directed by your doctor. The Human Papillomavirus is a sexually transmitted disease. It can be passed by any kind of sexual activity that involves genital contact. The vaccine works best when given before you have any contact with the virus. Many people who have the virus do not have any signs or symptoms. Tell your doctor or health care professional if you have any reaction or unusual symptom after getting  the vaccine. What side effects may I notice from receiving this medicine? Side effects that you should report to your doctor or health care professional as soon as possible: -allergic reactions like skin rash, itching or hives, swelling of the face, lips, or tongue -breathing problems -feeling faint or lightheaded, falls Side effects that usually do not  require medical attention (report to your doctor or health care professional if they continue or are bothersome): -cough -dizziness -fever -headache -nausea -redness, warmth, swelling, pain, or itching at site where injected This list may not describe all possible side effects. Call your doctor for medical advice about side effects. You may report side effects to FDA at 1-800-FDA-1088. Where should I keep my medicine? This drug is given in a hospital or clinic and will not be stored at home. NOTE: This sheet is a summary. It may not cover all possible information. If you have questions about this medicine, talk to your doctor, pharmacist, or health care provider.    2016, Elsevier/Gold Standard. (2013-02-24 13:14:33)     IF you received an x-ray today, you will receive an invoice from [facility]. Please contact [facility] at [phone] with questions or concerns regarding your invoice.   IF you received labwork today, you will receive an invoice from [employer]. Please contact [facility] at [phone] with questions or concerns regarding your invoice.   Our billing staff will not be able to assist you with questions regarding bills from these companies.  You will be contacted with the lab results as soon as they are available. The fastest way to get your results is to activate your My Chart account. Instructions are located on the last page of this paperwork. If you have not heard from [place] regarding the results in 2 weeks, please contact this office.

## 2015-11-12 LAB — HEPATITIS PANEL, ACUTE
HCV AB: NEGATIVE
HEP A IGM: NONREACTIVE
HEP B C IGM: NONREACTIVE
HEP B S AG: NEGATIVE

## 2015-11-12 LAB — RPR

## 2015-11-12 LAB — GC/CHLAMYDIA PROBE AMP
CT PROBE, AMP APTIMA: NOT DETECTED
GC PROBE AMP APTIMA: NOT DETECTED

## 2015-11-12 LAB — HIV ANTIBODY (ROUTINE TESTING W REFLEX): HIV: NONREACTIVE

## 2015-11-15 ENCOUNTER — Telehealth: Payer: Self-pay

## 2015-11-15 NOTE — Telephone Encounter (Signed)
Patient states that he was told he would get his lab results within 24-48 hours. Patient would like his lab results soon as possible. Please advise! [phone]

## 2015-11-15 NOTE — Telephone Encounter (Signed)
Just FYI.

## 2015-11-15 NOTE — Telephone Encounter (Signed)
Advise patient that results are negative

## 2015-11-16 NOTE — Telephone Encounter (Signed)
Thank you for doing this!

## 2016-01-26 ENCOUNTER — Ambulatory Visit: Payer: BLUE CROSS/BLUE SHIELD

## 2016-03-30 ENCOUNTER — Ambulatory Visit (INDEPENDENT_AMBULATORY_CARE_PROVIDER_SITE_OTHER): Payer: BLUE CROSS/BLUE SHIELD | Admitting: Physician Assistant

## 2016-03-30 ENCOUNTER — Encounter: Payer: Self-pay | Admitting: Physician Assistant

## 2016-03-30 VITALS — BP 122/84 | HR 64 | Temp 97.4°F | Resp 16 | Ht 71.0 in | Wt 145.0 lb

## 2016-03-30 DIAGNOSIS — Z113 Encounter for screening for infections with a predominantly sexual mode of transmission: Secondary | ICD-10-CM | POA: Diagnosis not present

## 2016-03-30 DIAGNOSIS — IMO0001 Reserved for inherently not codable concepts without codable children: Secondary | ICD-10-CM

## 2016-03-30 DIAGNOSIS — Z23 Encounter for immunization: Secondary | ICD-10-CM | POA: Diagnosis not present

## 2016-03-30 DIAGNOSIS — Z9189 Other specified personal risk factors, not elsewhere classified: Secondary | ICD-10-CM | POA: Diagnosis not present

## 2016-03-30 DIAGNOSIS — J309 Allergic rhinitis, unspecified: Secondary | ICD-10-CM | POA: Diagnosis not present

## 2016-03-30 MED ORDER — LORATADINE-PSEUDOEPHEDRINE ER 10-240 MG PO TB24: 1.0000 | ORAL_TABLET | Freq: Every day | ORAL | 3 refills | Status: DC

## 2016-03-30 MED ORDER — FLUTICASONE PROPIONATE 50 MCG/ACT NA SUSP: 2.0000 | Freq: Every day | NASAL | 12 refills | Status: DC

## 2016-03-30 NOTE — Patient Instructions (Addendum)
I have given you a referral for infectious disease. They should contact you within the next week, please call our office if they do not contact you.   I will also release your lab results via mychart if all is normal. If anything is abnormal, I will call you and discuss results. In the future, try to get STD testing after each partner to ensure optimal health before engaging in sexual intercourse with another individual.   Please return in 6 months for your third gardasil vaccine.  Thank you for letting me participate in your health and well being.   IF you received an x-ray today, you will receive an invoice from [facility]. Please contact [facility] at [phone] with questions or concerns regarding your invoice.   IF you received labwork today, you will receive an invoice from [facility]. Please contact [facility] at [phone] with questions or concerns regarding your invoice.   Our billing staff will not be able to assist you with questions regarding bills from these companies.  You will be contacted with the lab results as soon as they are available. The fastest way to get your results is to activate your My Chart account. Instructions are located on the last page of this paperwork. If you have not heard from us regarding the results in 2 weeks, please contact this office.

## 2016-03-30 NOTE — Progress Notes (Signed)
   Adrian Johnson  MRN: [id number] DOB: 1990/10/11  Subjective:  Adrian Johnson is a 26 y.o. male seen in office today for a chief complaint of need of second dose of gardisil vaccine and STD check. He has had 3-4 new partners since last STD testing in 11/11/15. He has sexual intercourse with both males and females. He is the receiver in male intercourse. He is not having any symptoms today. Pt would also like referral to a doctor that will prescribe PrEP for him as he is engaging in sexual intercourse with males.   Review of Systems  Constitutional: Negative for chills, diaphoresis and fever.  HENT: Positive for congestion, rhinorrhea, sinus pressure and sneezing. Negative for sore throat.   Eyes: Positive for itching.  Gastrointestinal: Negative for abdominal pain, nausea and vomiting.  Genitourinary: Negative for discharge, genital sores, penile pain, penile swelling, scrotal swelling and testicular pain.  Allergic/Immunologic: Positive for environmental allergies (would like rx for allergy medicine becuase he states when he buys it over the coutner it is more expensive ).  Hematological: Negative for adenopathy.    There are no active problems to display for this patient.   No current outpatient prescriptions on file prior to visit.   No current facility-administered medications on file prior to visit.     No Known Allergies      Objective:  BP 122/84   Pulse 64   Temp 97.4 F (36.3 C) (Oral)   Resp 16   Ht 5\' 11"  (1.803 m)   Wt 145 lb (65.8 kg)   SpO2 100%   BMI 20.22 kg/m   Physical Exam  Constitutional: He is oriented to person, place, and time and well-developed, well-nourished, and in no distress.  HENT:  Head: Normocephalic and atraumatic.  Mouth/Throat: Uvula is midline, oropharynx is clear and moist and mucous membranes are normal.  Eyes: Conjunctivae are normal.  Neck: Normal range of motion.  Cardiovascular: Normal rate, regular rhythm and normal heart  sounds.   Pulmonary/Chest: Effort normal and breath sounds normal.  Neurological: He is alert and oriented to person, place, and time. Gait normal.  Skin: Skin is warm and dry.  Psychiatric: Affect normal.  Vitals reviewed.   Assessment and Plan :  1. Screen for STD (sexually transmitted disease) -Await lab results - HIV antibody - Trichomonas vaginalis, RNA - GC/Chlamydia Probe Amp - RPR - Hepatitis panel, acute  2. Need for HPV vaccination -Return in 6 months for 3rd vaccination - HPV 9-valent vaccine,Recombinat  3. At risk for HIV due to homosexual contact - Ambulatory referral to Infectious Disease  4. Allergic rhinitis, unspecified chronicity, unspecified seasonality, unspecified trigger - loratadine-pseudoephedrine (CLARITIN-D 24 HOUR) 10-240 MG 24 hr tablet; Take 1 tablet by mouth daily.  Dispense: 90 tablet; Refill: 3 - fluticasone (FLONASE) 50 MCG/ACT nasal spray; Place 2 sprays into both nostrils daily.  Dispense: 16 g; Refill: 12  Benjiman CoreBrittany Haylei Cobin PA-C  [facility] 03/30/2016 10:43 AM

## 2016-03-31 LAB — GC/CHLAMYDIA PROBE AMP
CHLAMYDIA, DNA PROBE: NEGATIVE
Neisseria gonorrhoeae by PCR: NEGATIVE

## 2016-03-31 LAB — HEPATITIS PANEL, ACUTE
HEP A IGM: NEGATIVE
Hep B C IgM: NEGATIVE
Hep C Virus Ab: <0.1 s/co ratio (ref 0.0–0.9)
Hepatitis B Surface Ag: NEGATIVE

## 2016-03-31 LAB — HIV ANTIBODY (ROUTINE TESTING W REFLEX): HIV Screen 4th Generation wRfx: NONREACTIVE

## 2016-03-31 LAB — RPR: RPR Ser Ql: NONREACTIVE

## 2016-04-03 ENCOUNTER — Telehealth: Payer: Self-pay | Admitting: Physician Assistant

## 2016-04-03 LAB — TRICHOMONAS VAGINALIS, PROBE AMP: TRICH VAG BY NAA: NEGATIVE

## 2016-04-03 NOTE — Telephone Encounter (Signed)
Patient called wanting to know her lab results, she would like to have someone give her call ASAP with this results her labs were done on 3/15   Her number is [phone]

## 2016-04-04 NOTE — Telephone Encounter (Signed)
Pt calling about his lab results please respond 

## 2016-04-05 NOTE — Telephone Encounter (Signed)
Pt advised, and saw mychart message

## 2016-07-21 ENCOUNTER — Encounter: Payer: Self-pay | Admitting: *Deleted

## 2016-07-21 ENCOUNTER — Other Ambulatory Visit: Payer: Self-pay

## 2016-07-21 ENCOUNTER — Ambulatory Visit (INDEPENDENT_AMBULATORY_CARE_PROVIDER_SITE_OTHER): Payer: Self-pay | Admitting: Pharmacist

## 2016-07-21 DIAGNOSIS — Z7251 High risk heterosexual behavior: Secondary | ICD-10-CM

## 2016-07-21 LAB — HEPATITIS C ANTIBODY: HCV AB: NEGATIVE

## 2016-07-21 LAB — BASIC METABOLIC PANEL
BUN: 9 mg/dL (ref 7–25)
CALCIUM: 9.9 mg/dL (ref 8.6–10.3)
CO2: 29 mmol/L (ref 20–31)
Chloride: 100 mmol/L (ref 98–110)
Creat: 0.91 mg/dL (ref 0.60–1.35)
Glucose, Bld: 92 mg/dL (ref 65–99)
Potassium: 4 mmol/L (ref 3.5–5.3)
Sodium: 138 mmol/L (ref 135–146)

## 2016-07-21 LAB — HEPATITIS A ANTIBODY, TOTAL: Hep A Total Ab: REACTIVE — AB

## 2016-07-21 LAB — HEPATITIS B SURFACE ANTIBODY,QUALITATIVE: HEP B S AB: NEGATIVE

## 2016-07-21 LAB — HEPATITIS B SURFACE ANTIGEN: HEP B S AG: NEGATIVE

## 2016-07-21 NOTE — Patient Instructions (Signed)
We will send in your prescription for Truvada to [facility] when you lab results come back negative. We will see you in about a month. Please call with any concerns/issues! Nice to meet you!

## 2016-07-21 NOTE — Progress Notes (Signed)
HPI: Adrian Johnson is a 26 y.o. male who presents today interested in starting PrEP for HIV prevention. He was referred from the research nurse.  Allergies: No Known Allergies  Vitals:    Past Medical History: No past medical history on file.  Social History: Social History   Social History  . Marital status: Single    Spouse name: N/A  . Number of children: N/A  . Years of education: N/A   Social History Main Topics  . Smoking status: Never Smoker  . Smokeless tobacco: Never Used  . Alcohol use Yes  . Drug use: No  . Sexual activity: Not on file   Other Topics Concern  . Not on file   Social History Narrative  . No narrative on file   Labs: Hep B S Ab (no units)  Date Value  09/23/2015 NEG   Hepatitis B Surface Ag (no units)  Date Value  03/30/2016 Negative   HCV Ab (no units)  Date Value  11/11/2015 NEGATIVE   Assessment: Adrian Johnson is a 26 year old male who is interested in starting PrEP. He heard about it through the community and would like to start Truvada. He currently has one male partner and they engage in oral and anal sex. He is both the receptive and insertive partner. He reports that he does use condoms and has had 3 partners in the last 6 months. I reviewed the importance of continued condom use while on PrEP since it does not protect against STDs.   Adrian Johnson is well educated on PrEP even prior to our visit. He was previously using a  Telemedicine service called "[facility]" to get prescribed PrEP back in March. He only did the initial intake visit and did not continue because it was expensive and required many online physician visits. He has never taken Truvada before. He was excited when he learned about our clinic and that we prescribed PrEP. He is going to share this information with his friends.  Adrian Johnson is still on his family's insurance and this will likely terminate when he turns 26. He is employed at [employer] and is planning to enroll in  their insurance plan when he becomes eligible.   Today we will perform all baseline laboratory testing for new PrEP patients, including an HIV test. If this comes back negative, I will call in a prescription for one month of Truvada to [facility]. Adrian Johnson is comfortable with the staff there and would like to fill his medication there. He states he may consider [facility] in the future. I provided the patient with a copay card and explained that he must take this medication everyday without missing doses. He plans to take it every morning.  Recommendations: - HIV antibody - If HIV antibody negative, will send in Truvada prescription x 1 month - STI screen (oral, anal and urine) - Hepatitis panel - RPR - Follow up appointment scheduled with pharmacy for 8/3  Casilda Carlsaylor Briteny Fulghum, PharmD, BCPS PGY-2 Infectious Diseases Pharmacy Resident Pager: [phone] 07/21/2016, 11:50 AM

## 2016-07-22 LAB — HIV ANTIBODY (ROUTINE TESTING W REFLEX): HIV: NONREACTIVE

## 2016-07-22 LAB — RPR

## 2016-07-24 ENCOUNTER — Other Ambulatory Visit: Payer: Self-pay

## 2016-07-24 MED ORDER — EMTRICITABINE-TENOFOVIR DF 200-300 MG PO TABS: 1.0000 | ORAL_TABLET | Freq: Every day | ORAL | 0 refills | Status: DC

## 2016-07-24 NOTE — Addendum Note (Signed)
Addended by: Robinette HainesKUPPELWEISER, Jemia Fata L on: 07/24/2016 09:58 AM   Modules accepted: Level of Service

## 2016-07-25 LAB — CYTOLOGY, (ORAL, ANAL, URETHRAL) ANCILLARY ONLY
CHLAMYDIA, DNA PROBE: NEGATIVE
Chlamydia: NEGATIVE
NEISSERIA GONORRHEA: NEGATIVE
NEISSERIA GONORRHEA: NEGATIVE

## 2016-07-25 LAB — URINE CYTOLOGY ANCILLARY ONLY
CHLAMYDIA, DNA PROBE: NEGATIVE
Neisseria Gonorrhea: NEGATIVE

## 2016-08-18 ENCOUNTER — Ambulatory Visit: Payer: Self-pay

## 2016-08-18 ENCOUNTER — Other Ambulatory Visit: Payer: Self-pay | Admitting: Pharmacist Clinician (PhC)/ Clinical Pharmacy Specialist

## 2016-08-18 ENCOUNTER — Ambulatory Visit (INDEPENDENT_AMBULATORY_CARE_PROVIDER_SITE_OTHER): Payer: 59 | Admitting: Pharmacist Clinician (PhC)/ Clinical Pharmacy Specialist

## 2016-08-18 DIAGNOSIS — Z7251 High risk heterosexual behavior: Secondary | ICD-10-CM

## 2016-08-18 DIAGNOSIS — Z23 Encounter for immunization: Secondary | ICD-10-CM

## 2016-08-18 NOTE — Progress Notes (Signed)
HPI: Adrian Johnson is a 26 y.o. male who is here for a 1 mo PrEP visit with pharmacy.   Allergies: No Known Allergies  Vitals:    Past Medical History: No past medical history on file.  Social History: Social History   Social History  . Marital status: Single    Spouse name: N/A  . Number of children: N/A  . Years of education: N/A   Social History Main Topics  . Smoking status: Never Smoker  . Smokeless tobacco: Never Used  . Alcohol use Yes  . Drug use: No  . Sexual activity: Not on file   Other Topics Concern  . Not on file   Social History Narrative  . No narrative on file    Current Regimen: TRV  Labs: Hep B S Ab (no units)  Date Value  07/21/2016 NEG   Hepatitis B Surface Ag (no units)  Date Value  07/21/2016 NEGATIVE   HCV Ab (no units)  Date Value  07/21/2016 NEGATIVE    CrCl: CrCl cannot be calculated (Patient's most recent lab result is older than the maximum 21 days allowed.).  Lipids: No results found for: CHOL, TRIG, HDL, CHOLHDL, VLDL, LDLCALC  Assessment: Adrian Johnson started on TRV about a month ago. He has about 4 tablets left. He has not missed any doses. He currently works for [employer] [place] He is a versatile partner. He has one sexual encounter in the past 30 days. His condom use has been very good at 100%. All of the STDs tests came back negative at the last visit. Explained to him that we are going to repeat his HIV test today along with his renal function. If that test is negative, we'll send in 3 mo supply of Truvada and make a 3 mo visit at that time. Encourage consistent condom use and medication compliant.   His HBsab is neg so we will start vaccination series today.   Recommendations:  HIV ab/bmet today Truvada x 3 months if neg Hep B #1 today F/u next month for hep B#2  Adrian Johnson, PharmD, BCPS, AAHIVP, CPP Clinical Infectious Disease Pharmacist [facility] 08/18/2016, 2:41 PM

## 2016-08-18 NOTE — Patient Instructions (Signed)
Come back and see me in one month for the second vaccine Continue Truvada

## 2016-08-19 LAB — BASIC METABOLIC PANEL
BUN: 12 mg/dL (ref 7–25)
CALCIUM: 9.7 mg/dL (ref 8.6–10.3)
CO2: 25 mmol/L (ref 20–31)
Chloride: 102 mmol/L (ref 98–110)
Creat: 0.85 mg/dL (ref 0.60–1.35)
GLUCOSE: 90 mg/dL (ref 65–99)
Potassium: 4.5 mmol/L (ref 3.5–5.3)
SODIUM: 141 mmol/L (ref 135–146)

## 2016-08-19 LAB — HIV ANTIBODY (ROUTINE TESTING W REFLEX): HIV 1&2 Ab, 4th Generation: NONREACTIVE

## 2016-08-21 ENCOUNTER — Other Ambulatory Visit: Payer: Self-pay | Admitting: Pharmacist Clinician (PhC)/ Clinical Pharmacy Specialist

## 2016-08-21 MED ORDER — EMTRICITABINE-TENOFOVIR DF 200-300 MG PO TABS: 1.0000 | ORAL_TABLET | Freq: Every day | ORAL | 2 refills | Status: DC

## 2016-08-21 NOTE — Progress Notes (Signed)
HIV ab neg. 3 months Truvada

## 2016-09-05 ENCOUNTER — Telehealth: Payer: Self-pay

## 2016-09-05 NOTE — Telephone Encounter (Signed)
Patient stated someone called him yesterday.   Laurell Josephs, RN

## 2016-09-20 ENCOUNTER — Ambulatory Visit: Payer: 59

## 2016-10-09 ENCOUNTER — Ambulatory Visit (INDEPENDENT_AMBULATORY_CARE_PROVIDER_SITE_OTHER): Payer: 59 | Admitting: Pharmacist

## 2016-10-09 DIAGNOSIS — Z23 Encounter for immunization: Secondary | ICD-10-CM

## 2016-10-09 DIAGNOSIS — Z7251 High risk heterosexual behavior: Secondary | ICD-10-CM

## 2016-10-09 NOTE — Progress Notes (Signed)
HPI: Adrian Johnson is a 26 y.o. male who presents to the [facility] pharmacy clinic today for PrEP follow-up and to get vaccinations.  Allergies: No Known Allergies  Past Medical History: No past medical history on file.  Social History: Social History   Social History  . Marital status: Single    Spouse name: N/A  . Number of children: N/A  . Years of education: N/A   Social History Main Topics  . Smoking status: Never Smoker  . Smokeless tobacco: Never Used  . Alcohol use Yes  . Drug use: No  . Sexual activity: Not on file   Other Topics Concern  . Not on file   Social History Narrative  . No narrative on file    Current Regimen: Truvada  Labs: Hep B S Ab (no units)  Date Value  07/21/2016 NEG   Hepatitis B Surface Ag (no units)  Date Value  07/21/2016 NEGATIVE   HCV Ab (no units)  Date Value  07/21/2016 NEGATIVE    CrCl: CrCl cannot be calculated (Patient's most recent lab result is older than the maximum 21 days allowed.).  Lipids: No results found for: CHOL, TRIG, HDL, CHOLHDL, VLDL, LDLCALC  Assessment: Adrian Johnson is here today to follow-up for PrEP and to get his 3rd HPV and 2nd Hep B vaccine.  He remains on Truvada but states he was out for 1 week due to [facility] not filling it due to insurance issue (?).  He states his insurance required a re-authorization that [facility] had to do and then they were not open on the weekend, so he was out for 1 week.  Other than that, he takes it every day. No issues or s/sx of STIs or HIV. He will get his 3rd HPV vaccine and 2nd Hep B vaccine today.  He declined the flu shot.   Plans: - Continue Truvada PO once daily - 2nd Hep B and 3rd HPV vaccine today - F/u with [place] again 11/5 at 11am  Cassie L. Kuppelweiser, PharmD, CPP Infectious Diseases Clinical Pharmacist [facility] 10/09/2016, 3:09 PM

## 2016-11-20 ENCOUNTER — Ambulatory Visit (INDEPENDENT_AMBULATORY_CARE_PROVIDER_SITE_OTHER): Payer: 59 | Admitting: Pharmacist

## 2016-11-20 ENCOUNTER — Other Ambulatory Visit (HOSPITAL_COMMUNITY)
Admission: RE | Admit: 2016-11-20 | Discharge: 2016-11-20 | Disposition: A | Discharge status: Home / Self Care | Payer: 59 | Source: Ambulatory Visit | Attending: Infectious Disease | Admitting: Infectious Disease

## 2016-11-20 DIAGNOSIS — Z7252 High risk homosexual behavior: Secondary | ICD-10-CM | POA: Insufficient documentation

## 2016-11-20 NOTE — Progress Notes (Addendum)
HPI: Adrian Johnson is a 26 y.o. male who presents to the pharmacy clinic today for his 3 month f/u for PrEP.   Allergies: No Known Allergies  Vitals:    Past Medical History: No past medical history on file.  Social History: Social History   Socioeconomic History  . Marital status: Single    Spouse name: Not on file  . Number of children: Not on file  . Years of education: Not on file  . Highest education level: Not on file  Social Needs  . Financial resource strain: Not on file  . Food insecurity - worry: Not on file  . Food insecurity - inability: Not on file  . Transportation needs - medical: Not on file  . Transportation needs - non-medical: Not on file  Occupational History  . Not on file  Tobacco Use  . Smoking status: Never Smoker  . Smokeless tobacco: Never Used  Substance and Sexual Activity  . Alcohol use: Yes  . Drug use: No  . Sexual activity: Not on file  Other Topics Concern  . Not on file  Social History Narrative  . Not on file     Current Regimen: Truvada  Labs: Hep B S Ab (no units)  Date Value  07/21/2016 NEG   Hepatitis B Surface Ag (no units)  Date Value  07/21/2016 NEGATIVE   HCV Ab (no units)  Date Value  07/21/2016 NEGATIVE    CrCl: CrCl cannot be calculated (Patient's most recent lab result is older than the maximum 21 days allowed.).  Lipids: No results found for: CHOL, TRIG, HDL, CHOLHDL, VLDL, LDLCALC  Assessment: Adrian Johnson presents today for his 3 month f/u for PrEP today. He has been doing well on his Truvada. He has not missed any doses. He takes it around 1 PM every day. He did report having some chills recently but they have since resolved. He refused the flu shot again today.   Adrian Johnson has had 2 new partners since we last saw him. He alternated between the insertive and receptive partner in both cases but did not have any oral sex. He used condoms with one partner but not the other. We will check a rectal, oral and  urine G/C and RPR today. We will re-check his HIV today. He has enough Truvada for a couple more days.   Recommendations: - HIV ab today - Rectal, oral, urine GC today - RPR  - If HIV negative, will send in 3 months of Truvada  - F/U with pharmacy 1/28 at 11:00 AM  Della GooEmily S Sinclair, PharmD PGY2 Infectious Diseases Pharmacy Resident  [facility] 11/20/2016, 11:35 AM

## 2016-11-20 NOTE — Progress Notes (Signed)
Agree with the assessment and plan outlined in Adrian Johnson's note above. Adrian Johnson is doing well on Truvada. No missed doses and no side effects.  No s/sx of acute HIV or STIs. He has had inconsistent condom use with his recent partners.  Educated on risk reduction strategies including condom use for STI prevention.  Will check 3 sites for STIs, RPR, and HIV antibody.

## 2016-11-21 ENCOUNTER — Telehealth: Payer: Self-pay | Admitting: Pharmacist

## 2016-11-21 ENCOUNTER — Other Ambulatory Visit: Payer: Self-pay | Admitting: Pharmacist

## 2016-11-21 DIAGNOSIS — Z7252 High risk homosexual behavior: Secondary | ICD-10-CM

## 2016-11-21 LAB — HIV ANTIBODY (ROUTINE TESTING W REFLEX): HIV 1&2 Ab, 4th Generation: NONREACTIVE

## 2016-11-21 LAB — URINE CYTOLOGY ANCILLARY ONLY
Chlamydia: NEGATIVE
NEISSERIA GONORRHEA: NEGATIVE

## 2016-11-21 LAB — CYTOLOGY, (ORAL, ANAL, URETHRAL) ANCILLARY ONLY
CHLAMYDIA, DNA PROBE: NEGATIVE
CHLAMYDIA, DNA PROBE: NEGATIVE
NEISSERIA GONORRHEA: NEGATIVE
Neisseria Gonorrhea: NEGATIVE

## 2016-11-21 MED ORDER — EMTRICITABINE-TENOFOVIR DF 200-300 MG PO TABS: 1.0000 | ORAL_TABLET | Freq: Every day | ORAL | 2 refills | Status: DC

## 2016-11-21 NOTE — Telephone Encounter (Signed)
Perfect

## 2016-11-21 NOTE — Telephone Encounter (Signed)
Called Adrian Johnson to tell him that his HIV antibody came back negative.  Will send in 3 months of Truvada for him.

## 2016-11-24 ENCOUNTER — Other Ambulatory Visit: Payer: Self-pay | Admitting: Infectious Disease

## 2016-11-29 IMAGING — CR DG ABDOMEN ACUTE W/ 1V CHEST
3 series · 3 of 3 positions shown · non-contrast
Comparison: None.

CLINICAL DATA: Abdominal pain

EXAM:
ACUTE ABDOMEN SERIES (ABDOMEN 2 VIEW & CHEST 1 VIEW)

[PA]
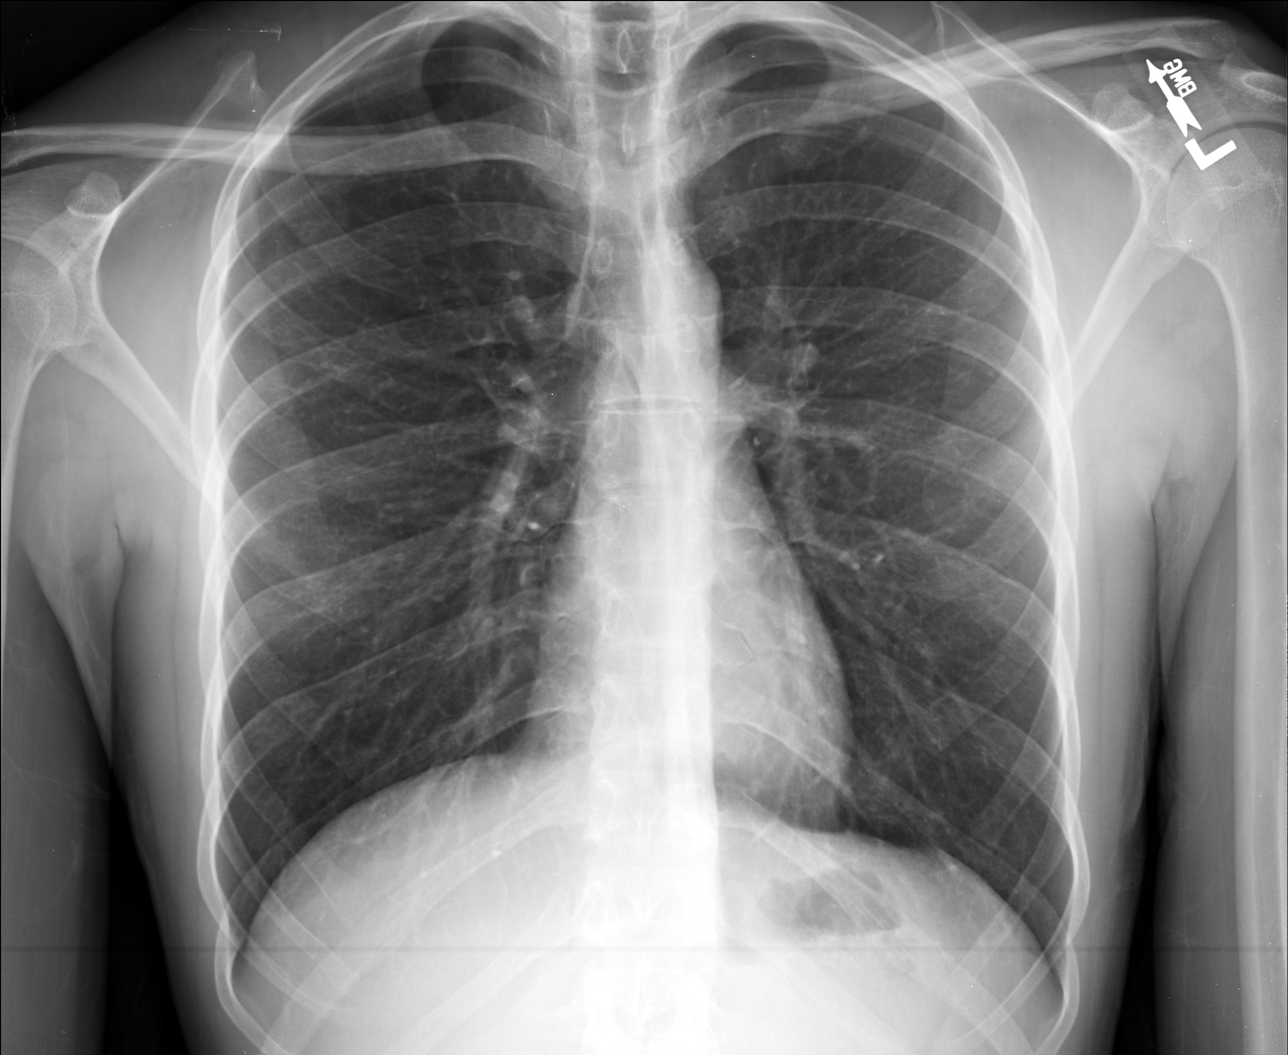

[AP (1 of 2)]
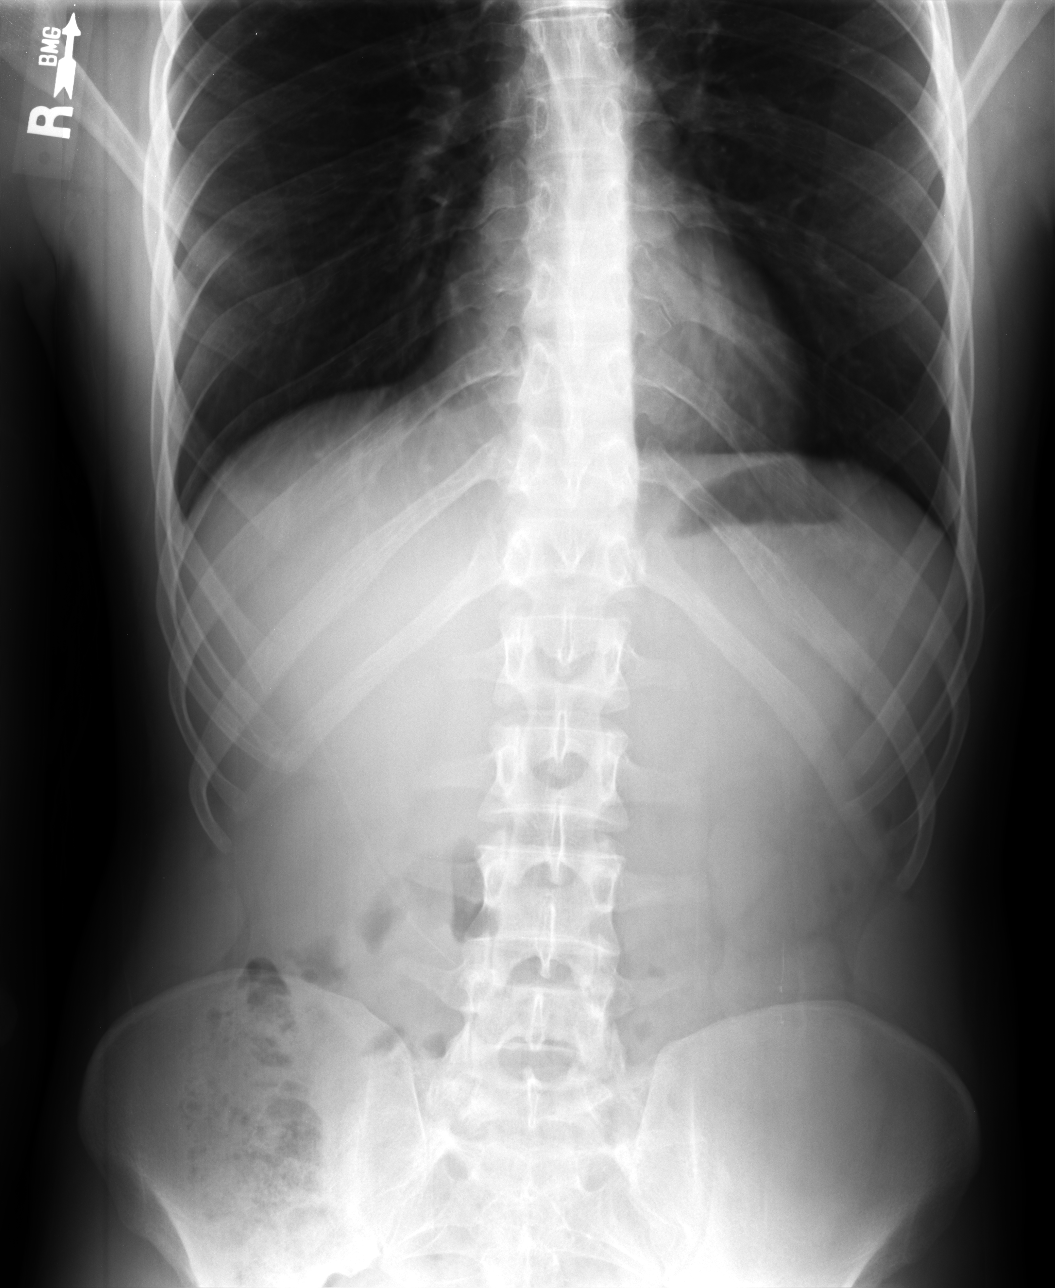

[AP (2 of 2)]
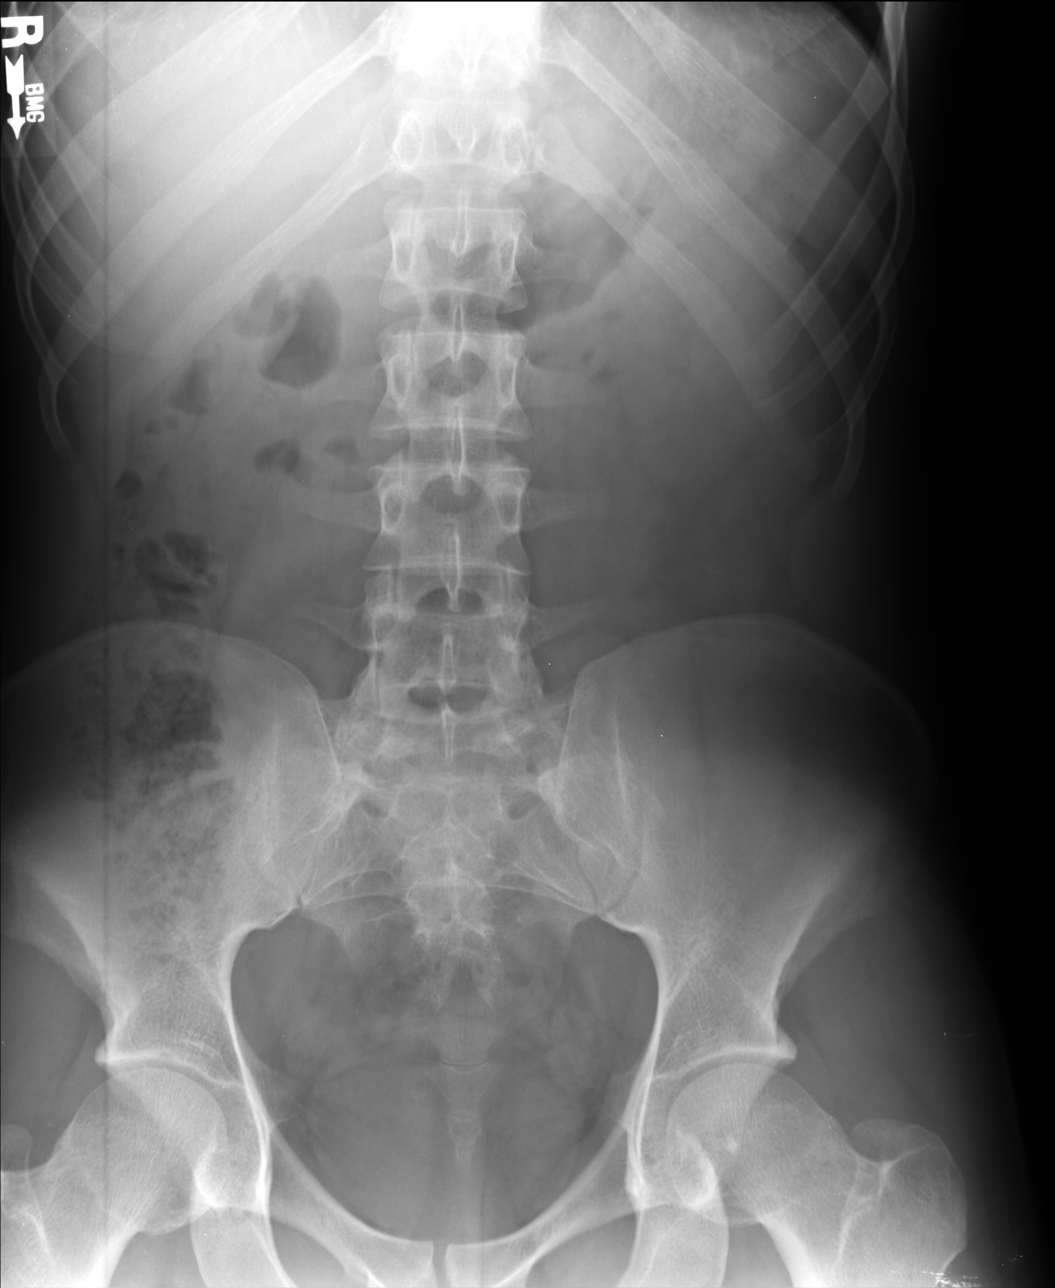

[3 of 3 positions shown; findings below may reference images not displayed]

FINDINGS: No active infiltrate or effusion is seen. Mediastinal and hilar
contours are unremarkable. The heart shadow is within normal limits
in size. No bony abnormality is seen.

Supine and erect views of the abdomen show no bowel obstruction.
There is no radiographic evidence of constipation. No free air is
seen. No opaque calculi are noted.
IMPRESSION: 1. No active lung disease.
2. No bowel obstruction.  No free air.

## 2017-02-12 ENCOUNTER — Ambulatory Visit (INDEPENDENT_AMBULATORY_CARE_PROVIDER_SITE_OTHER): Payer: 59 | Admitting: Pharmacist

## 2017-02-12 DIAGNOSIS — Z7251 High risk heterosexual behavior: Secondary | ICD-10-CM | POA: Diagnosis not present

## 2017-02-12 NOTE — Progress Notes (Signed)
[facility] Pharmacy Visit  HPI: Adrian Johnson is a 27 y.o. male who presents to the [facility] pharmacy clinic for his 3 month PrEP follow-up.  Patient Active Problem List   Diagnosis Date Noted  . High risk sexual behavior 07/21/2016    Patient's Medications  New Prescriptions   No medications on file  Previous Medications   EMTRICITABINE-TENOFOVIR (TRUVADA) 200-300 MG TABLET    Take 1 tablet daily by mouth.   FLUTICASONE (FLONASE) 50 MCG/ACT NASAL SPRAY    Place 2 sprays into both nostrils daily.   LORATADINE-PSEUDOEPHEDRINE (CLARITIN-D 24 HOUR) 10-240 MG 24 HR TABLET    Take 1 tablet by mouth daily.  Modified Medications   No medications on file  Discontinued Medications   No medications on file    Allergies: No Known Allergies  Past Medical History: No past medical history on file.  Social History: Social History   Socioeconomic History  . Marital status: Single    Spouse name: Not on file  . Number of children: Not on file  . Years of education: Not on file  . Highest education level: Not on file  Social Needs  . Financial resource strain: Not on file  . Food insecurity - worry: Not on file  . Food insecurity - inability: Not on file  . Transportation needs - medical: Not on file  . Transportation needs - non-medical: Not on file  Occupational History  . Not on file  Tobacco Use  . Smoking status: Never Smoker  . Smokeless tobacco: Never Used  Substance and Sexual Activity  . Alcohol use: Yes  . Drug use: No  . Sexual activity: Not on file  Other Topics Concern  . Not on file  Social History Narrative  . Not on file    Labs: Hep B S Ab (no units)  Date Value  07/21/2016 NEG   Hepatitis B Surface Ag (no units)  Date Value  07/21/2016 NEGATIVE   HCV Ab (no units)  Date Value  07/21/2016 NEGATIVE    HIV Pre-Exposure Prophylaxis (PrEP): Patient's risk for HIV: MSM Sexual partner preference: males Number of sexual  partners in the last 3 months: 0 Condom use? n/a Sexual activity preference: n/a Symptoms of acute HIV? no Last date of BMET: 08/18/16 Last date of STI testing: 11/20/16 Last negative HIV antibody test: 11/20/16  Assessment: Adrian Johnson is here today to follow-up for PrEP.  He has been taking Truvada with no issues or side effects. No missed doses.  He has ~7 pills left in his last bottle of his 3 month Rx.  [name] has had zero sexual partners since he was last seen in November. He tells me today he thinks he wants to stop taking Truvada.  He says he isn't sexually active right now and focusing on himself. He is worried about the effects on his body that Truvada is doing. We spent some time going over this - I explained that last time he saw me in November, he had 2 new sexual partners with inconsistent condom use and anal intercourse.  I told him he was at a high risk of getting HIV with condomless anal intercourse. I also went over the long term side effects of Truvada including the decreased kidney function and bone mineral density.  I also told him that Descovy was being studied for PrEP right now and hopefully sometime in the next several years it will be approved as well.  He states he will  continue taking it for now and decide later if he wants to stop or not.  I told him that if he became sexually active again that he 100% needs to be on Truvada. He understands.  Will get BMET and HIV antibody today.  Will defer STI testing as we checked back in November and he has had no partners since we last checked.  He will call me if he decides to stop, if not he will come back and see me in 3 months.  Plan: - Continue Truvada if HIV negative - BMET and HIV antibody today - F/u with me again 4/29 at 11am  Cassie L. Kuppelweiser, PharmD, AAHIVP, CPP Infectious Diseases Clinical Pharmacist [facility] 02/12/2017, 11:06 AM

## 2017-02-13 ENCOUNTER — Telehealth: Payer: Self-pay | Admitting: Pharmacist

## 2017-02-13 LAB — BASIC METABOLIC PANEL
BUN: 17 mg/dL (ref 7–25)
CO2: 29 mmol/L (ref 20–32)
CREATININE: 0.83 mg/dL (ref 0.60–1.35)
Calcium: 9.2 mg/dL (ref 8.6–10.3)
Chloride: 101 mmol/L (ref 98–110)
Glucose, Bld: 134 mg/dL — ABNORMAL HIGH (ref 65–99)
POTASSIUM: 4.5 mmol/L (ref 3.5–5.3)
Sodium: 137 mmol/L (ref 135–146)

## 2017-02-13 LAB — HIV ANTIBODY (ROUTINE TESTING W REFLEX): HIV: NONREACTIVE

## 2017-02-13 NOTE — Telephone Encounter (Signed)
Called Lisbeth PlyJulius to let him know that his HIV antibody was negative. Will send in 3 more months of Truvada.

## 2017-02-13 NOTE — Telephone Encounter (Signed)
Perfect

## 2017-02-20 ENCOUNTER — Other Ambulatory Visit: Payer: Self-pay | Admitting: Pharmacist

## 2017-02-20 DIAGNOSIS — Z7252 High risk homosexual behavior: Secondary | ICD-10-CM

## 2017-02-20 MED ORDER — EMTRICITABINE-TENOFOVIR DF 200-300 MG PO TABS: 1.0000 | ORAL_TABLET | Freq: Every day | ORAL | 2 refills | Status: DC

## 2017-04-26 ENCOUNTER — Other Ambulatory Visit (HOSPITAL_COMMUNITY)
Admission: RE | Admit: 2017-04-26 | Discharge: 2017-04-26 | Disposition: A | Discharge status: Home / Self Care | Payer: 59 | Source: Ambulatory Visit | Attending: Infectious Disease | Admitting: Infectious Disease

## 2017-04-26 ENCOUNTER — Ambulatory Visit (INDEPENDENT_AMBULATORY_CARE_PROVIDER_SITE_OTHER): Payer: 59 | Admitting: Pharmacist Clinician (PhC)/ Clinical Pharmacy Specialist

## 2017-04-26 DIAGNOSIS — Z23 Encounter for immunization: Secondary | ICD-10-CM | POA: Diagnosis not present

## 2017-04-26 DIAGNOSIS — Z7252 High risk homosexual behavior: Secondary | ICD-10-CM | POA: Diagnosis not present

## 2017-04-26 NOTE — Progress Notes (Addendum)
Date:  04/26/2017   Insured   [x]    Uninsured  []    HPI  Adrian Johnson is a 27 y.o. male here for 3 month PrEP follow up.  Demographics Race:  Other or two or more races [6] Marital Status:  Single  Allergies No Known Allergies  No past medical history on file.  Outpatient Encounter Medications as of 04/26/2017  Medication Sig  . emtricitabine-tenofovir (TRUVADA) 200-300 MG tablet Take 1 tablet by mouth daily.  Marland Kitchen. loratadine-pseudoephedrine (CLARITIN-D 24 HOUR) 10-240 MG 24 hr tablet Take 1 tablet by mouth daily.  . fluticasone (FLONASE) 50 MCG/ACT nasal spray Place 2 sprays into both nostrils daily. (Patient not taking: Reported on 08/18/2016)   No facility-administered encounter medications on file as of 04/26/2017.     Social History   Tobacco Use  Smoking Status Never Smoker  Smokeless Tobacco Never Used   Social History   Substance and Sexual Activity  Alcohol Use Yes    Drug use?   Yes []  No [x]   Injectable drug use?   Yes []     No [x]   Sexual History  Missing doses? Yes [x]    No  []   [facility] HIV PREP FLOWSHEET RESULTS 04/26/2017 02/12/2017 11/20/2016 11/20/2016 08/18/2016  Insurance Status Insured Insured Insured Insured Insured  Gender at birth Male Male Male Male Male  Gender identity cis-Male cis-Male cis-Male cis-Male cis-Male  Risk for HIV - - Condomless vaginal or anal intercourse Condomless vaginal or anal intercourse In sexual relationship with HIV+ partner  Risk for HIV Condomless vaginal or anal intercourse Condomless vaginal or anal intercourse - - -  Sex Partners Men only Men only Men only Men only Men only  # of sex partners - - 2 2 1   # sex partners past 3-6 mos 1-3 - - - -  Sex activity preferences Insertive and receptive;Oral - Insertive and receptive Insertive and receptive;Oral Insertive and receptive  Condom use Yes - Yes Yes Yes  % condom use 90 - 50 50 100  Partners genders and ages 52M 20-24;M 25-29 - M 20-24;M 25-29 M 20-24;M 25-29 -  Treated  for STI? No - - - -  HIV symptoms? Sore throat N/A - - -  PrEP Eligibility - - CrCl > 60 - HIV negative;No Symptoms of HIV;CrCl > 60  Paper work received? - - - - No    Labs: HIV Lab Results  Component Value Date   HIV NON-REACTIVE 02/12/2017   HIV NON-REACTIVE 11/20/2016   HIV NONREACTIVE 08/18/2016   HIV NONREACTIVE 07/21/2016   HIV Non Reactive 03/30/2016    GFR CrCl cannot be calculated (Patient's most recent lab result is older than the maximum 21 days allowed.).  Hepatitis B Lab Results  Component Value Date   HEPBSAB NEG 07/21/2016   Lab Results  Component Value Date   HEPBSAG NEGATIVE 07/21/2016    Hepatitis C Lab Results  Component Value Date   HCVAB NEGATIVE 07/21/2016    Hepatitis A Lab Results  Component Value Date   HAV REACTIVE (A) 07/21/2016    RPR and STI Lab Results  Component Value Date   LABRPR NON REAC 07/21/2016   LABRPR Non Reactive 03/30/2016   LABRPR NON REAC 11/11/2015   LABRPR NON REAC 12/17/2014   LABRPR NON REAC 12/31/2013    STI Results GC GC CT CT  11/20/2016 Negative - Negative -  11/20/2016 Negative - Negative -  11/20/2016 Negative - Negative -  07/21/2016 Negative - Negative -  07/21/2016 Negative - Negative -  07/21/2016 Negative - Negative -  03/30/2016 - - Negative -  11/11/2015 - NOT DETECTED - NOT DETECTED  09/23/2015 - NOT DETECTED - NOT DETECTED  12/17/2014 - NEGATIVE - NEGATIVE  12/31/2013 - NEGATIVE - NEGATIVE     Assessment  Adrian Johnson is here today for his 3 month PrEP follow up visit. He did stop taking Truvada for about ~1 week in the last 2 weeks (restarted about 5 days ago). This was because he was worried about his health because he and his mother had read about kidney issues with Truvada online. We discussed that young, healthy patients like him are at low risk of kidney issues, and that most of kidney problems with Truvada happen in older patients on 3-drug HIV regimens with multiple comorbidities. We also  discussed that Descovy will soon be available for PrEP and this has much lower risk of kidney or bone problems, so hopefully we can switch to this in the near future.   Adrian Johnson reports he has had 1 sexual partner in the last 3-4 months, and he did not have sex while he was off of PrEP or within the week after restarting. He reports condom use 90% of the time. He did have a sore throat recently but no other symptoms of viral illness or STI. We will swab all sites today and give the last Hep B vaccine in his series.  He is still not sure how much longer he wants to take PrEP since he is not very sexually active. We discussed his risk for contracting HIV is high as an African-American MSM and he agrees he will continue Truvada at least in the short term. We provided him with another copay card since his will expire this summer. Adrian Johnson also asked about referral to a primary care provider and we gave him the welcome packet for Family Medicine to fill out.   Recommendations  PrEP labs today - HIV ab, RPR, oral/rectal swabs and urine cytology If HIV negative send 3 months of Truvada to [facility] Hep B vaccine today (3 of 3) Follow up in 3 months   Adrian Johnson, PharmD PGY1 Pharmacy Resident [facility] 04/26/17 11:15 AM   Agreed with [name]'s note. Reassured him that he could be on Descovy soon for PrEP which is much safer for his kidneys.   Adrian Johnson, PharmD, BCPS, AAHIVP, CPP Infectious Disease Pharmacist Pager: [phone] 04/26/2017 3:03 PM

## 2017-04-27 ENCOUNTER — Telehealth: Payer: Self-pay | Admitting: Pharmacist

## 2017-04-27 DIAGNOSIS — Z7252 High risk homosexual behavior: Secondary | ICD-10-CM

## 2017-04-27 LAB — CYTOLOGY, (ORAL, ANAL, URETHRAL) ANCILLARY ONLY
Chlamydia: NEGATIVE
Chlamydia: NEGATIVE
Neisseria Gonorrhea: NEGATIVE
Neisseria Gonorrhea: NEGATIVE

## 2017-04-27 LAB — URINE CYTOLOGY ANCILLARY ONLY
Chlamydia: NEGATIVE
Neisseria Gonorrhea: NEGATIVE

## 2017-04-27 LAB — RPR: RPR Ser Ql: NONREACTIVE

## 2017-04-27 LAB — HIV ANTIBODY (ROUTINE TESTING W REFLEX): HIV: NONREACTIVE

## 2017-04-27 MED ORDER — EMTRICITABINE-TENOFOVIR DF 200-300 MG PO TABS: 1.0000 | ORAL_TABLET | Freq: Every day | ORAL | 2 refills | Status: DC

## 2017-04-27 NOTE — Telephone Encounter (Signed)
Called Lisbeth PlyJulius to let him know his HIV antibody was negative.  Will send in 3 more months of Truvada to [facility].

## 2017-05-14 ENCOUNTER — Ambulatory Visit: Payer: 59

## 2017-07-23 ENCOUNTER — Ambulatory Visit (INDEPENDENT_AMBULATORY_CARE_PROVIDER_SITE_OTHER): Payer: 59 | Admitting: Pharmacist Clinician (PhC)/ Clinical Pharmacy Specialist

## 2017-07-23 DIAGNOSIS — Z7252 High risk homosexual behavior: Secondary | ICD-10-CM | POA: Diagnosis not present

## 2017-07-23 NOTE — Progress Notes (Signed)
Date:  07/23/2017   Insured   [x]    Uninsured  []    HPI  Adrian Johnson is a 27 y.o. male who is here for his routine 3 mo PrEP visit.    Demographics Race:  Other or two or more races [6] Marital Status:  Single  Allergies No Known Allergies  No past medical history on file.  Outpatient Encounter Medications as of 07/23/2017  Medication Sig  . emtricitabine-tenofovir (TRUVADA) 200-300 MG tablet Take 1 tablet by mouth daily.  Marland Kitchen loratadine-pseudoephedrine (CLARITIN-D 24 HOUR) 10-240 MG 24 hr tablet Take 1 tablet by mouth daily.  . [DISCONTINUED] fluticasone (FLONASE) 50 MCG/ACT nasal spray Place 2 sprays into both nostrils daily. (Patient not taking: Reported on 08/18/2016)   No facility-administered encounter medications on file as of 07/23/2017.     Social History   Tobacco Use  Smoking Status Never Smoker  Smokeless Tobacco Never Used   Social History   Substance and Sexual Activity  Alcohol Use Yes    Drug use?   Yes []  No [x]   Injectable drug use?   Yes []     No [x]   Sexual History  Missing doses? Yes [x]    No  []   CH[facility] HIV PREP FLOWSHEET RESULTS 07/23/2017 04/26/2017 02/12/2017 11/20/2016 11/20/2016 08/18/2016  Insurance Status Insured Insured Insured Insured Insured Insured  How did you hear? Friends - - - - -  Gender at birth Male Male Male Male Male Male  Gender identity cis-Male cis-Male cis-Male cis-Male cis-Male cis-Male  Risk for HIV - - - Condomless vaginal or anal intercourse Condomless vaginal or anal intercourse In sexual relationship with HIV+ partner  Risk for HIV - Condomless vaginal or anal intercourse Condomless vaginal or anal intercourse - - -  Sex Partners Men only Men only Men only Men only Men only Men only  # of sex partners - - - 2 2 1   # sex partners past 3-6 mos 1-3 1-3 - - - -  Sex activity preferences Insertive and receptive;Oral Insertive and receptive;Oral - Insertive and receptive Insertive and receptive;Oral Insertive and receptive    Condom use Yes Yes - Yes Yes Yes  % condom use 100 90 - 50 50 100  Partners genders and ages 86 20-24;M 25-29;M 6-49 M 20-24;M 25-29 - M 20-24;M 25-29 M 20-24;M 25-29 -  Treated for STI? No No - - - -  HIV symptoms? N/A Sore throat N/A - - -  PrEP Eligibility HIV negative;CrCl >60 ml/min;Substantial risk for HIV - - CrCl > 60 - HIV negative;No Symptoms of HIV;CrCl > 60  Paper work received? No - - - - No    Labs: Creatinine Lab Results  Component Value Date   CREATININE 0.83 02/12/2017   CREATININE 0.85 08/18/2016   CREATININE 0.91 07/21/2016    HIV Lab Results  Component Value Date   HIV NON-REACTIVE 04/26/2017   HIV NON-REACTIVE 02/12/2017   HIV NON-REACTIVE 11/20/2016   HIV NONREACTIVE 08/18/2016   HIV NONREACTIVE 07/21/2016    GFR CrCl cannot be calculated (Patient's most recent lab result is older than the maximum 21 days allowed.).  Hepatitis B Lab Results  Component Value Date   HEPBSAB NEG 07/21/2016   Lab Results  Component Value Date   HEPBSAG NEGATIVE 07/21/2016    Hepatitis C Lab Results  Component Value Date   HCVAB NEGATIVE 07/21/2016    Hepatitis A Lab Results  Component Value Date   HAV REACTIVE (A) 07/21/2016  RPR and STI Lab Results  Component Value Date   LABRPR NON-REACTIVE 04/26/2017   LABRPR NON REAC 07/21/2016   LABRPR Non Reactive 03/30/2016   LABRPR NON REAC 11/11/2015   LABRPR NON REAC 12/17/2014    STI Results GC GC CT CT  04/26/2017 Negative - Negative -  04/26/2017 Negative - Negative -  04/26/2017 Negative - Negative -  11/20/2016 Negative - Negative -  11/20/2016 Negative - Negative -  11/20/2016 Negative - Negative -  07/21/2016 Negative - Negative -  07/21/2016 Negative - Negative -  07/21/2016 Negative - Negative -  03/30/2016 - - Negative -  11/11/2015 - NOT DETECTED - NOT DETECTED  09/23/2015 - NOT DETECTED - NOT DETECTED  12/17/2014 - NEGATIVE - NEGATIVE  12/31/2013 - NEGATIVE - NEGATIVE     Assessment  Adrian Johnson  is here for his 3 mo PrEP visit. He missed one dose while he was out of town but he didn't have any sexual encounters during that time. He is very good with his condom usage. He has had about 2 encounters with 2 different partners since he last saw us. He has not any symptoms of acute HIV.   All of his STDs were neg at the last visit so we will skip them to the next visit. We will check a bmp and hep B sab today. We will send his truvada to [facility] if he is neg today.   Recommendations   HIV ab, bmet, Hbsab today Three months of Truvada if neg  Ulyses SouthwardMinh Sandrine Bloodsworth, PharmD, BCPS, AAHIVP, CPP Infectious Disease Pharmacist Pager: [phone] 07/23/2017 11:04 AM

## 2017-07-24 ENCOUNTER — Telehealth: Payer: Self-pay | Admitting: Pharmacist Clinician (PhC)/ Clinical Pharmacy Specialist

## 2017-07-24 DIAGNOSIS — Z7252 High risk homosexual behavior: Secondary | ICD-10-CM

## 2017-07-24 LAB — BASIC METABOLIC PANEL
BUN: 12 mg/dL (ref 7–25)
CALCIUM: 9.8 mg/dL (ref 8.6–10.3)
CO2: 28 mmol/L (ref 20–32)
CREATININE: 0.76 mg/dL (ref 0.60–1.35)
Chloride: 103 mmol/L (ref 98–110)
GLUCOSE: 89 mg/dL (ref 65–99)
Potassium: 4.8 mmol/L (ref 3.5–5.3)
Sodium: 139 mmol/L (ref 135–146)

## 2017-07-24 LAB — HIV ANTIBODY (ROUTINE TESTING W REFLEX): HIV 1&2 Ab, 4th Generation: NONREACTIVE

## 2017-07-24 LAB — HEPATITIS B SURFACE ANTIBODY,QUALITATIVE: HEP B S AB: REACTIVE — AB

## 2017-07-24 MED ORDER — EMTRICITABINE-TENOFOVIR DF 200-300 MG PO TABS: 1.0000 | ORAL_TABLET | Freq: Every day | ORAL | 2 refills | Status: DC

## 2017-07-24 NOTE — Telephone Encounter (Signed)
HIV neg so 3 mo of Truvada. Left a generic VM

## 2017-09-04 ENCOUNTER — Telehealth: Payer: Self-pay | Admitting: *Deleted

## 2017-09-04 NOTE — Telephone Encounter (Signed)
Patient is returning 9/30 for PrEP visit.  He called asking for advice, states he was told his copay card is no longer working and he will have to pay $3000 for his Truvada this month. He states he has been using this for about 1 year.  RN advised patient where he could access a copay card immediately online. Patient declined, stating "isn't there somewhere I can pick one up, I'm very busy between working 2 jobs." He is not sure if the pharmacy has been billing his insurance/copay appropriately. RN advised accessing a copay card online would take only a few minutes, but he was more than able to come to the office to pick up one as well. Patient agreed, will call back if he has further issues. Andree CossHowell, Kahlen Morais M, RN

## 2017-10-15 ENCOUNTER — Ambulatory Visit: Payer: 59

## 2017-10-26 ENCOUNTER — Ambulatory Visit: Payer: 59

## 2018-01-28 ENCOUNTER — Other Ambulatory Visit (HOSPITAL_COMMUNITY)
Admission: RE | Admit: 2018-01-28 | Discharge: 2018-01-28 | Disposition: A | Discharge status: Home / Self Care | Payer: BLUE CROSS/BLUE SHIELD | Source: Ambulatory Visit | Attending: Infectious Disease | Admitting: Infectious Disease

## 2018-01-28 ENCOUNTER — Ambulatory Visit (INDEPENDENT_AMBULATORY_CARE_PROVIDER_SITE_OTHER): Payer: BLUE CROSS/BLUE SHIELD | Admitting: Pharmacist

## 2018-01-28 DIAGNOSIS — Z7252 High risk homosexual behavior: Secondary | ICD-10-CM | POA: Diagnosis not present

## 2018-01-28 NOTE — Progress Notes (Signed)
Date:  01/28/2018   HPI: Adrian Johnson is a 28 y.o. male presenting to [facility] clinic for PrEP follow-up.  Insured   [x]    Uninsured  []    Patient Active Problem List   Diagnosis Date Noted  . High risk sexual behavior 07/21/2016    Patient's Medications  New Prescriptions   No medications on file  Previous Medications   EMTRICITABINE-TENOFOVIR (TRUVADA) 200-300 MG TABLET    Take 1 tablet by mouth daily.   LORATADINE-PSEUDOEPHEDRINE (CLARITIN-D 24 HOUR) 10-240 MG 24 HR TABLET    Take 1 tablet by mouth daily.  Modified Medications   No medications on file  Discontinued Medications   No medications on file    Allergies: No Known Allergies  Past Medical History: No past medical history on file.  Social History: Social History   Socioeconomic History  . Marital status: Single    Spouse name: Not on file  . Number of children: Not on file  . Years of education: Not on file  . Highest education level: Not on file  Occupational History  . Not on file  Social Needs  . Financial resource strain: Not on file  . Food insecurity:    Worry: Not on file    Inability: Not on file  . Transportation needs:    Medical: Not on file    Non-medical: Not on file  Tobacco Use  . Smoking status: Never Smoker  . Smokeless tobacco: Never Used  Substance and Sexual Activity  . Alcohol use: Yes  . Drug use: No  . Sexual activity: Not on file  Lifestyle  . Physical activity:    Days per week: Not on file    Minutes per session: Not on file  . Stress: Not on file  Relationships  . Social connections:    Talks on phone: Not on file    Gets together: Not on file    Attends religious service: Not on file    Active member of club or organization: Not on file    Attends meetings of clubs or organizations: Not on file    Relationship status: Not on file  Other Topics Concern  . Not on file  Social History Narrative  . Not on file    [facility] PREP FLOWSHEET RESULTS 07/23/2017  04/26/2017 02/12/2017 11/20/2016 11/20/2016 08/18/2016  Insurance Status Insured Insured Insured Insured Insured Insured  How did you hear? Friends - - - - -  Gender at birth Male Male Male Male Male Male  Gender identity cis-Male cis-Male cis-Male cis-Male cis-Male cis-Male  Risk for HIV - - - Condomless vaginal or anal intercourse Condomless vaginal or anal intercourse In sexual relationship with HIV+ partner  Risk for HIV - Condomless vaginal or anal intercourse Condomless vaginal or anal intercourse - - -  Sex Partners Men only Men only Men only Men only Men only Men only  # of sex partners - - - 2 2 1   # sex partners past 3-6 mos 1-3 1-3 - - - -  Sex activity preferences Insertive and receptive;Oral Insertive and receptive;Oral - Insertive and receptive Insertive and receptive;Oral Insertive and receptive  Condom use Yes Yes - Yes Yes Yes  % condom use 100 90 - 50 50 100  Partners genders and ages 58M 20-24;M 25-29;M 3530-49 M 20-24;M 25-29 - M 20-24;M 25-29 M 20-24;M 25-29 -  Treated for STI? No No - - - -  HIV symptoms? N/A Sore throat N/A - - -  PrEP Eligibility HIV negative;CrCl >60 ml/min;Substantial risk for HIV - - CrCl > 60 - HIV negative;No Symptoms of HIV;CrCl > 60  Paper work received? No - - - - No    Labs:  SCr: Lab Results  Component Value Date   CREATININE 0.76 07/23/2017   CREATININE 0.83 02/12/2017   CREATININE 0.85 08/18/2016   CREATININE 0.91 07/21/2016   HIV Lab Results  Component Value Date   HIV NON-REACTIVE 07/23/2017   HIV NON-REACTIVE 04/26/2017   HIV NON-REACTIVE 02/12/2017   HIV NON-REACTIVE 11/20/2016   HIV NONREACTIVE 08/18/2016   Hepatitis B Lab Results  Component Value Date   HEPBSAB REACTIVE (A) 07/23/2017   HEPBSAG NEGATIVE 07/21/2016   Hepatitis C No results found for: HEPCAB, HCVRNAPCRQN Hepatitis A Lab Results  Component Value Date   HAV REACTIVE (A) 07/21/2016   RPR and STI Lab Results  Component Value Date   LABRPR NON-REACTIVE  04/26/2017   LABRPR NON REAC 07/21/2016   LABRPR Non Reactive 03/30/2016   LABRPR NON REAC 11/11/2015   LABRPR NON REAC 12/17/2014    STI Results GC GC CT CT  04/26/2017 Negative - Negative -  04/26/2017 Negative - Negative -  04/26/2017 Negative - Negative -  11/20/2016 Negative - Negative -  11/20/2016 Negative - Negative -  11/20/2016 Negative - Negative -  07/21/2016 Negative - Negative -  07/21/2016 Negative - Negative -  07/21/2016 Negative - Negative -  03/30/2016 - - Negative -  11/11/2015 - NOT DETECTED - NOT DETECTED  09/23/2015 - NOT DETECTED - NOT DETECTED  12/17/2014 - NEGATIVE - NEGATIVE  12/31/2013 - NEGATIVE - NEGATIVE    Assessment: Adrian Johnson presents to clinic today for a presumed PrEP follow-up appointment. He was last seen in July but states that he stopped Truvada in September when he ran out of refills. He has not had any sexual encounters since September as he he has been working two jobs and does not wish to continue PrEP at this time. We discussed the differences between Truvada and Descovy if he chooses to re-initiate PrEP in the future. We also discussed the 2-1-1 method of taking two Descovy pills no more than 24 hours but no less than 2 hours before a sexual encounter, then one pill 24 hours after and one pill 48 hours after that sexual  encounter. This could be a more feasible option for someone that is not regularly engaging in sexual activity rather than adhering to a daily medication. Adrian Johnson was not interested at this time but was encouraged to call Cassie if he does want to restart therapy at all.  Adrian Johnson does not currently have a PCP and was inquiring where he should go for regular STI testing. Since he does not wish to continue PrEP at this time, he was encouraged to go to [facility] or the health department for testing whenever he needs to go. He denies any concerns for an STI at this time as he has not had any recent encounters or any other symptoms other than post-nasal  drip/sore throat due to a cold for about two weeks. Will order HIV antibody and full STI cytologies today. Advised patient to call if he becomes sexually active again and wishes to initiate PrEP.  Plan: -HIV antibody -Oral/rectal/urine swabs, RPR   Lawernce Keas P4 Pharmacy Student [facility] 01/28/2018, 9:57 AM

## 2018-01-29 LAB — CYTOLOGY, (ORAL, ANAL, URETHRAL) ANCILLARY ONLY
CHLAMYDIA, DNA PROBE: NEGATIVE
Chlamydia: NEGATIVE
Neisseria Gonorrhea: NEGATIVE
Neisseria Gonorrhea: NEGATIVE

## 2018-01-29 LAB — RPR: RPR Ser Ql: NONREACTIVE

## 2018-01-29 LAB — HIV ANTIBODY (ROUTINE TESTING W REFLEX): HIV 1&2 Ab, 4th Generation: NONREACTIVE

## 2018-01-29 LAB — URINE CYTOLOGY ANCILLARY ONLY
Chlamydia: NEGATIVE
Neisseria Gonorrhea: NEGATIVE

## 2018-02-04 ENCOUNTER — Telehealth: Payer: Self-pay | Admitting: Behavioral Health

## 2018-02-04 NOTE — Telephone Encounter (Signed)
Thanks Morrie Sheldon, appreciate it!

## 2018-02-04 NOTE — Telephone Encounter (Signed)
Patient called requesting HIV antibody results.  Verifed patient's identity (name, DOB, DR, and last 4 of social) and informed him results were negative.  Patient states he does not take PREP and will not be taking it.  Encouraged him to use condoms.  Patient verified understanding. Angeline Slim RN

## 2018-04-08 ENCOUNTER — Telehealth: Payer: BLUE CROSS/BLUE SHIELD | Admitting: Physician Assistant

## 2018-04-08 ENCOUNTER — Encounter: Payer: Self-pay | Admitting: Family

## 2018-04-08 ENCOUNTER — Telehealth: Payer: BLUE CROSS/BLUE SHIELD | Admitting: Family

## 2018-04-08 DIAGNOSIS — J309 Allergic rhinitis, unspecified: Secondary | ICD-10-CM | POA: Diagnosis not present

## 2018-04-08 DIAGNOSIS — R05 Cough: Secondary | ICD-10-CM

## 2018-04-08 DIAGNOSIS — J029 Acute pharyngitis, unspecified: Secondary | ICD-10-CM | POA: Diagnosis not present

## 2018-04-08 DIAGNOSIS — R059 Cough, unspecified: Secondary | ICD-10-CM

## 2018-04-08 MED ORDER — AMOXICILLIN 500 MG PO CAPS: 500.0000 mg | ORAL_CAPSULE | Freq: Two times a day (BID) | ORAL | 0 refills | Status: DC

## 2018-04-08 MED ORDER — PROMETHAZINE-DM 6.25-15 MG/5ML PO SYRP: 5.0000 mL | ORAL_SOLUTION | Freq: Four times a day (QID) | ORAL | 0 refills | Status: DC | PRN

## 2018-04-08 MED ORDER — CETIRIZINE HCL 10 MG PO TABS: 10.0000 mg | ORAL_TABLET | Freq: Every day | ORAL | 0 refills | Status: DC

## 2018-04-08 MED ORDER — FLUTICASONE PROPIONATE 50 MCG/ACT NA SUSP: 2.0000 | Freq: Every day | NASAL | 0 refills | Status: DC

## 2018-04-08 NOTE — Progress Notes (Signed)

## 2018-04-08 NOTE — Progress Notes (Signed)
E visit for Allergic Rhinitis We are sorry that you are not feeling well.  Here is how we plan to help!  Based on what you have shared with me it looks like you have Allergic Rhinitis.  Rhinitis is when a reaction occurs that causes nasal congestion, runny nose, sneezing, and itching.  Most types of rhinitis are caused by an inflammation and are associated with symptoms in the eyes ears or throat. There are several types of rhinitis.  The most common are acute rhinitis, which is usually caused by a viral illness, allergic or seasonal rhinitis, and nonallergic or year-round rhinitis.  Nasal allergies occur certain times of the year.  Allergic rhinitis is caused when allergens in the air trigger the release of histamine in the body.  Histamine causes itching, swelling, and fluid to build up in the fragile linings of the nasal passages, sinuses and eyelids.  An itchy nose and clear discharge are common.  I recommend the following over the counter treatments: Cetirizine 10 mg once daily  I also would recommend a nasal spray: Flonase 2 sprays into each nostril once daily    Mr. Adrian Johnson,  For your sore throat, I dont believe that your currently have a bacterial infection that warrants antibiotics. However, if your symptoms persist or worsen, you can take the prescribed antibiotic for your sore throat- amoxicillin 500 mg, take one pill twice daily for 10 days.    HOME CARE:   You can use an over-the-counter saline nasal spray as needed  Avoid areas where there is heavy dust, mites, or molds  Stay indoors on windy days during the pollen season  Keep windows closed in home, at least in bedroom; use air conditioner.  Use high-efficiency house air filter  Keep windows closed in car, turn AC on re-circulate  Avoid playing out with dog during pollen season  GET HELP RIGHT AWAY IF:   If your symptoms do not improve within 10 days  You become short of breath  You develop yellow or green  discharge from your nose for over 3 days  You have coughing fits  MAKE SURE YOU:   Understand these instructions  Will watch your condition  Will get help right away if you are not doing well or get worse  Thank you for choosing an e-visit. Your e-visit answers were reviewed by a board certified advanced clinical practitioner to complete your personal care plan. Depending upon the condition, your plan could have included both over the counter or prescription medications. Please review your pharmacy choice. Be sure that the pharmacy you have chosen is open so that you can pick up your prescription now.  If there is a problem you may message your provider in MyChart to have the prescription routed to another pharmacy. Your safety is important to [place]. If you have drug allergies check your prescription carefully.  For the next 24 hours, you can use MyChart to ask questions about today's visit, request a non-urgent call back, or ask for a work or school excuse from your e-visit provider. You will get an email in the next two days asking about your experience. I hope that your e-visit has been valuable and will speed your recovery.        I have spent 7 min in completion and review of this note- Illa Level [facility]

## 2018-04-28 ENCOUNTER — Telehealth: Payer: BLUE CROSS/BLUE SHIELD | Admitting: Family

## 2018-04-28 DIAGNOSIS — R112 Nausea with vomiting, unspecified: Secondary | ICD-10-CM

## 2018-04-28 DIAGNOSIS — G43809 Other migraine, not intractable, without status migrainosus: Secondary | ICD-10-CM

## 2018-04-28 NOTE — Progress Notes (Signed)
Based on what you shared with me, I feel your condition warrants further evaluation and I recommend that you be seen for a face to face office visit.  I am sorry, but we do not treat migraines over an Evisit. I do not see migraines on your problem list/history.    NOTE: If you entered your credit card information for this eVisit, you will not be charged. You may see a "hold" on your card for the $35 but that hold will drop off and you will not have a charge processed.  If you are having a true medical emergency please call 911.  If you need an urgent face to face visit, [place] has four urgent care centers for your convenience.    PLEASE NOTE: THE [facility] LOCATIONS AND URGENT CARE CLINICS DO NOT HAVE THE TESTING FOR CORONAVIRUS COVID19 AVAILABLE.  IF YOU FEEL YOU NEED THIS TEST YOU MUST GO TO A TRIAGE LOCATION AT ONE OF THE HOSPITAL EMERGENCY DEPARTMENTS   [email or web address] to reserve your spot online an avoid wait times  [facility] [address] [place], [place] [address] Modified hours of operation: Monday-Friday, 10 AM to 6 PM  Saturday & Sunday 10 AM to 4 PM *Across the street from [employer]  [facility] (New Address!) [address], [address] [place], [place] [address] *Just off [employer], across the road from [place]* Modified hours of operation: Monday-Friday, 10 AM to 5 PM  Closed Saturday & Sunday   The following sites will take your insurance:  . [facility]  [phone] Get Driving Directions Find a Provider at this Location  [address] [place], [place] [address] . 10 am to 8 pm Monday-Friday . 12 pm to 8 pm Saturday-Sunday   . [facility] at [facility]  [phone] Get Driving Directions Find a Provider at this Location  [address] [place], [place] [address] . 8 am to 8 pm Monday-Friday . 9 am to 6 pm Saturday . 11 am to 6  pm Sunday   . [facility] at [facility]  [phone] Get Driving Directions  [address] [place], [place] [address] . 8 am to 8 pm Monday-Friday . 8 am to 4 pm Saturday-Sunday   Your e-visit answers were reviewed by a board certified advanced clinical practitioner to complete your personal care plan.  Thank you for using e-Visits.

## 2018-05-13 ENCOUNTER — Telehealth: Payer: BLUE CROSS/BLUE SHIELD | Admitting: Physician Assistant

## 2018-05-13 ENCOUNTER — Encounter: Payer: Self-pay | Admitting: Physician Assistant

## 2018-05-13 DIAGNOSIS — R197 Diarrhea, unspecified: Secondary | ICD-10-CM

## 2018-05-13 NOTE — Progress Notes (Signed)
We are sorry that you are not feeling well.  Here is how we plan to help!  Based on what you have shared with me it looks like you have Acute Infectious Diarrhea.  Most cases of acute diarrhea are due to infections with virus and bacteria and are self-limited conditions lasting less than 14 days.  For your symptoms you may take Imodium 2 mg tablets that are over the counter at your local pharmacy. Take two tablet now and then one after each loose stool up to 6 a day.   Antibiotics are not needed for most people with diarrhea.  I have also provided a work note for the next 3 days   HOME CARE  We recommend changing your diet to help with your symptoms for the next few days.  Drink plenty of fluids that contain water salt and sugar. Sports drinks such as Gatorade may help.   You may try broths, soups, bananas, applesauce, soft breads, mashed potatoes or crackers.   You are considered infectious for as long as the diarrhea continues. Hand washing or use of alcohol based hand sanitizers is recommend.  It is best to stay out of work or school until your symptoms stop.   GET HELP RIGHT AWAY  If you have dark yellow colored urine or do not pass urine frequently you should drink more fluids.    If your symptoms worsen   If you feel like you are going to pass out (faint)  You have a new problem  MAKE SURE YOU   Understand these instructions.  Will watch your condition.  Will get help right away if you are not doing well or get worse.  Your e-visit answers were reviewed by a board certified advanced clinical practitioner to complete your personal care plan.  Depending on the condition, your plan could have included both over the counter or prescription medications.  If there is a problem please reply  once you have received a response from your provider.  Your safety is important to [place].  If you have drug allergies check your prescription carefully.    You can use MyChart to ask  questions about today's visit, request a non-urgent call back, or ask for a work or school excuse for 24 hours related to this e-Visit. If it has been greater than 24 hours you will need to follow up with your provider, or enter a new e-Visit to address those concerns.   You will get an e-mail in the next two days asking about your experience.  I hope that your e-visit has been valuable and will speed your recovery. Thank you for using e-visits.  I have spent 7 min in completion and review of this note- Illa Level [facility]

## 2018-05-28 ENCOUNTER — Telehealth: Payer: BLUE CROSS/BLUE SHIELD | Admitting: Physician Assistant

## 2018-05-28 DIAGNOSIS — J302 Other seasonal allergic rhinitis: Secondary | ICD-10-CM

## 2018-05-28 MED ORDER — FLUTICASONE PROPIONATE 50 MCG/ACT NA SUSP: 2.0000 | Freq: Every day | NASAL | 6 refills | Status: DC

## 2018-05-28 NOTE — Progress Notes (Signed)
E visit for Allergic Rhinitis We are sorry that you are not feeling well.  Here is how we plan to help!  Based on what you have shared with me it looks like you have Allergic Rhinitis.  Rhinitis is when a reaction occurs that causes nasal congestion, runny nose, sneezing, and itching.  Most types of rhinitis are caused by an inflammation and are associated with symptoms in the eyes ears or throat. There are several types of rhinitis.  The most common are acute rhinitis, which is usually caused by a viral illness, allergic or seasonal rhinitis, and nonallergic or year-round rhinitis.  Nasal allergies occur certain times of the year.  Allergic rhinitis is caused when allergens in the air trigger the release of histamine in the body.  Histamine causes itching, swelling, and fluid to build up in the fragile linings of the nasal passages, sinuses and eyelids.  An itchy nose and clear discharge are common.  I recommend the following over the counter treatments: You should take a daily dose of antihistamine  I also would recommend a nasal spray: Flonase 2 sprays into each nostril once daily    HOME CARE:   You can use an over-the-counter saline nasal spray as needed  Avoid areas where there is heavy dust, mites, or molds  Stay indoors on windy days during the pollen season  Keep windows closed in home, at least in bedroom; use air conditioner.  Use high-efficiency house air filter  Keep windows closed in car, turn AC on re-circulate  Avoid playing out with dog during pollen season  GET HELP RIGHT AWAY IF:   If your symptoms do not improve within 10 days  You become short of breath  You develop yellow or green discharge from your nose for over 3 days  You have coughing fits  MAKE SURE YOU:   Understand these instructions  Will watch your condition  Will get help right away if you are not doing well or get worse  Thank you for choosing an e-visit. Your e-visit answers were  reviewed by a board certified advanced clinical practitioner to complete your personal care plan. Depending upon the condition, your plan could have included both over the counter or prescription medications. Please review your pharmacy choice. Be sure that the pharmacy you have chosen is open so that you can pick up your prescription now.  If there is a problem you may message your provider in MyChart to have the prescription routed to another pharmacy. Your safety is important to [place]. If you have drug allergies check your prescription carefully.  For the next 24 hours, you can use MyChart to ask questions about today's visit, request a non-urgent call back, or ask for a work or school excuse from your e-visit provider. You will get an email in the next two days asking about your experience. I hope that your e-visit has been valuable and will speed your recovery.   A total of 5-10 minutes was spent evaluating this patients questionnaire and formulating a plan of care.

## 2018-06-30 ENCOUNTER — Telehealth: Payer: BLUE CROSS/BLUE SHIELD | Admitting: Family

## 2018-06-30 DIAGNOSIS — J301 Allergic rhinitis due to pollen: Secondary | ICD-10-CM | POA: Diagnosis not present

## 2018-06-30 NOTE — Progress Notes (Signed)
E visit for Allergic Rhinitis We are sorry that you are not feeling well.  Here is how we plan to help!  Based on what you have shared with me it looks like you have Allergic Rhinitis.  Rhinitis is when a reaction occurs that causes nasal congestion, runny nose, sneezing, and itching.  Most types of rhinitis are caused by an inflammation and are associated with symptoms in the eyes ears or throat. There are several types of rhinitis.  The most common are acute rhinitis, which is usually caused by a viral illness, allergic or seasonal rhinitis, and nonallergic or year-round rhinitis.  Nasal allergies occur certain times of the year.  Allergic rhinitis is caused when allergens in the air trigger the release of histamine in the body.  Histamine causes itching, swelling, and fluid to build up in the fragile linings of the nasal passages, sinuses and eyelids.  An itchy nose and clear discharge are common.  I recommend the following over the counter treatments: Xyzal 5 mg take 1 tablet daily  I also would recommend a nasal spray: Flonase 2 sprays into each nostril once daily   I have attached your work note.   Approximately 5 minutes was spent documenting and reviewing patient's chart.     HOME CARE:   You can use an over-the-counter saline nasal spray as needed  Avoid areas where there is heavy dust, mites, or molds  Stay indoors on windy days during the pollen season  Keep windows closed in home, at least in bedroom; use air conditioner.  Use high-efficiency house air filter  Keep windows closed in car, turn AC on re-circulate  Avoid playing out with dog during pollen season  GET HELP RIGHT AWAY IF:   If your symptoms do not improve within 10 days  You become short of breath  You develop yellow or green discharge from your nose for over 3 days  You have coughing fits  MAKE SURE YOU:   Understand these instructions  Will watch your condition  Will get help right away  if you are not doing well or get worse  Thank you for choosing an e-visit. Your e-visit answers were reviewed by a board certified advanced clinical practitioner to complete your personal care plan. Depending upon the condition, your plan could have included both over the counter or prescription medications. Please review your pharmacy choice. Be sure that the pharmacy you have chosen is open so that you can pick up your prescription now.  If there is a problem you may message your provider in [place] to have the prescription routed to another pharmacy. Your safety is important to [place]. If you have drug allergies check your prescription carefully.  For the next 24 hours, you can use MyChart to ask questions about today's visit, request a non-urgent call back, or ask for a work or school excuse from your e-visit provider. You will get an email in the next two days asking about your experience. I hope that your e-visit has been valuable and will speed your recovery.

## 2018-08-11 ENCOUNTER — Encounter (INDEPENDENT_AMBULATORY_CARE_PROVIDER_SITE_OTHER): Payer: Self-pay

## 2018-08-11 ENCOUNTER — Telehealth: Payer: BLUE CROSS/BLUE SHIELD | Admitting: Family

## 2018-08-11 DIAGNOSIS — Z20828 Contact with and (suspected) exposure to other viral communicable diseases: Secondary | ICD-10-CM

## 2018-08-11 DIAGNOSIS — Z20822 Contact with and (suspected) exposure to covid-19: Secondary | ICD-10-CM

## 2018-08-11 NOTE — Progress Notes (Signed)
E-Visit for Corona Virus Screening   Your current symptoms could be consistent with the coronavirus.  Many health care providers can now test patients at their office but not all are.  [place] has multiple testing sites. For information on our COVID testing locations and hours go to [email or web address]  Please quarantine yourself while awaiting your test results.  We are enrolling you in our MyChart Home Montioring for COVID19 . Daily you will receive a questionnaire within the MyChart website. Our COVID 19 response team willl be monitoriing your responses daily.  Approximately 5 minutes was spent documenting and reviewing patient's chart.   You can go to one of the  testing sites listed below, while they are opened (see hours). You do need to self isolate until your results return and if positive 14 days from when your symptoms started and until you are 3 days symptom free.   Testing Locations (Monday - Friday, 8 a.m. - 3:30 p.m.) . [place]: [facility] at [facility], [address], [place]  . [place]: [address] [facility], [address] [place] ([address])  . [facility]: [address], [place], [place] (across from [facility])   COVID-19 is a respiratory illness with symptoms that are similar to the flu. Symptoms are typically mild to moderate, but there have been cases of severe illness and death due to the virus. The following symptoms may appear 2-14 days after exposure: . Fever . Cough . Shortness of breath or difficulty breathing . Chills . Repeated shaking with chills . Muscle pain . Headache . Sore throat . New loss of taste or smell . Fatigue . Congestion or runny nose . Nausea or vomiting . Diarrhea  It is vitally important that if you feel that you have an infection such as this virus or any other virus that you stay home and away from  places where you may spread it to others.  You should self-quarantine for 14 days if you have symptoms that could potentially be coronavirus or have been in close contact a with a person diagnosed with COVID-19 within the last 2 weeks. You should avoid contact with people age 28 and older.   You should wear a mask or cloth face covering over your nose and mouth if you must be around other people or animals, including pets (even at home). Try to stay at least 6 feet away from other people. This will protect the people around you.  You may also take acetaminophen (Tylenol) as needed for fever.   Reduce your risk of any infection by using the same precautions used for avoiding the common cold or flu:  Marland Kitchen. Wash your hands often with soap and warm water for at least 20 seconds.  If soap and water are not readily available, use an alcohol-based hand sanitizer with at least 60% alcohol.  . If coughing or sneezing, cover your mouth and nose by coughing or sneezing into the elbow areas of your shirt or coat, into a tissue or into your sleeve (not your hands). . Avoid shaking hands with others and consider head nods or verbal greetings only. . Avoid touching your eyes, nose, or mouth with unwashed hands.  . Avoid close contact with people who are sick. . Avoid places or events with large numbers of people in one location, like concerts or sporting events. . Carefully consider travel plans you have or are making. . If you  are planning any travel outside or inside the [place], visit the CDC's Travelers' Health webpage for the latest health notices. . If you have some symptoms but not all symptoms, continue to monitor at home and seek medical attention if your symptoms worsen. . If you are having a medical emergency, call 911.  HOME CARE . Only take medications as instructed by your medical team. . Drink plenty of fluids and get plenty of rest. . A steam or ultrasonic humidifier can help if you have congestion.    GET HELP RIGHT AWAY IF YOU HAVE EMERGENCY WARNING SIGNS** FOR COVID-19. If you or someone is showing any of these signs seek emergency medical care immediately. Call 911 or proceed to your closest emergency facility if: . You develop worsening high fever. . Trouble breathing . Bluish lips or face . Persistent pain or pressure in the chest . New confusion . Inability to wake or stay awake . You cough up blood. . Your symptoms become more severe  **This list is not all possible symptoms. Contact your medical provider for any symptoms that are sever or concerning to you.   MAKE SURE YOU   Understand these instructions.  Will watch your condition.  Will get help right away if you are not doing well or get worse.  Your e-visit answers were reviewed by a board certified advanced clinical practitioner to complete your personal care plan.  Depending on the condition, your plan could have included both over the counter or prescription medications.  If there is a problem please reply once you have received a response from your provider.  Your safety is important to [place].  If you have drug allergies check your prescription carefully.    You can use MyChart to ask questions about today's visit, request a non-urgent call back, or ask for a work or school excuse for 24 hours related to this e-Visit. If it has been greater than 24 hours you will need to follow up with your provider, or enter a new e-Visit to address those concerns. You will get an e-mail in the next two days asking about your experience.  I hope that your e-visit has been valuable and will speed your recovery. Thank you for using e-visits.

## 2018-08-12 ENCOUNTER — Other Ambulatory Visit: Payer: Self-pay

## 2018-08-12 ENCOUNTER — Encounter: Payer: Self-pay | Admitting: Family

## 2018-08-12 ENCOUNTER — Encounter (INDEPENDENT_AMBULATORY_CARE_PROVIDER_SITE_OTHER): Payer: Self-pay

## 2018-08-12 DIAGNOSIS — Z20822 Contact with and (suspected) exposure to covid-19: Secondary | ICD-10-CM

## 2018-08-12 DIAGNOSIS — R6889 Other general symptoms and signs: Secondary | ICD-10-CM | POA: Diagnosis not present

## 2018-08-13 ENCOUNTER — Encounter (INDEPENDENT_AMBULATORY_CARE_PROVIDER_SITE_OTHER): Payer: Self-pay

## 2018-08-13 LAB — NOVEL CORONAVIRUS, NAA: SARS-CoV-2, NAA: NOT DETECTED

## 2018-08-14 ENCOUNTER — Encounter (INDEPENDENT_AMBULATORY_CARE_PROVIDER_SITE_OTHER): Payer: Self-pay

## 2018-08-14 ENCOUNTER — Telehealth: Payer: Self-pay | Admitting: *Deleted

## 2018-08-14 NOTE — Telephone Encounter (Signed)
Contacted pt regarding his symptoms; he feels that it is related to his allergies because he has been sneezing; he has been taking OTC meds but they have not been helping; he has an application for [facility]; recommendation made for pt to go to Urgent Care due to chest heaviness; pt also advised that in order to return to work he must meet the following criteria: All persons with fever and respiratory symptoms should isolate themselves until ALL conditions listed below are met - at least 10 days since symptoms onset - AND 3 consecutive days fever free without antipyretics (acetaminophen [Tylenol] or ibuprofen [Advil]) - AND improvement in respiratory symptoms  He verbalized understanding.

## 2018-08-14 NOTE — Telephone Encounter (Signed)
Attempted to contact pt due to [place] response weakness 08/14/2018; left message on voicemail with callback number [phone].

## 2018-08-15 ENCOUNTER — Encounter: Payer: Self-pay | Admitting: Family

## 2018-08-15 ENCOUNTER — Encounter (INDEPENDENT_AMBULATORY_CARE_PROVIDER_SITE_OTHER): Payer: Self-pay

## 2018-08-15 NOTE — Progress Notes (Signed)
sent 

## 2018-08-16 ENCOUNTER — Other Ambulatory Visit: Payer: Self-pay | Admitting: Family

## 2018-08-16 ENCOUNTER — Encounter (INDEPENDENT_AMBULATORY_CARE_PROVIDER_SITE_OTHER): Payer: Self-pay

## 2018-08-17 ENCOUNTER — Encounter (INDEPENDENT_AMBULATORY_CARE_PROVIDER_SITE_OTHER): Payer: Self-pay

## 2018-08-18 ENCOUNTER — Encounter (INDEPENDENT_AMBULATORY_CARE_PROVIDER_SITE_OTHER): Payer: Self-pay

## 2018-08-19 ENCOUNTER — Encounter (INDEPENDENT_AMBULATORY_CARE_PROVIDER_SITE_OTHER): Payer: Self-pay

## 2018-08-20 ENCOUNTER — Encounter (INDEPENDENT_AMBULATORY_CARE_PROVIDER_SITE_OTHER): Payer: Self-pay

## 2018-08-21 ENCOUNTER — Encounter (INDEPENDENT_AMBULATORY_CARE_PROVIDER_SITE_OTHER): Payer: Self-pay

## 2018-08-22 ENCOUNTER — Encounter (INDEPENDENT_AMBULATORY_CARE_PROVIDER_SITE_OTHER): Payer: Self-pay

## 2018-08-24 ENCOUNTER — Encounter (INDEPENDENT_AMBULATORY_CARE_PROVIDER_SITE_OTHER): Payer: Self-pay

## 2018-09-03 ENCOUNTER — Telehealth: Payer: BC Managed Care – PPO | Admitting: Family

## 2018-09-03 ENCOUNTER — Encounter: Payer: Self-pay | Admitting: Family

## 2018-09-03 DIAGNOSIS — K591 Functional diarrhea: Secondary | ICD-10-CM

## 2018-09-03 DIAGNOSIS — Z20822 Contact with and (suspected) exposure to covid-19: Secondary | ICD-10-CM

## 2018-09-03 NOTE — Progress Notes (Signed)
We are sorry that you are not feeling well.  Here is how we plan to help!  Based on what you have shared with me it looks like you have Acute Infectious Diarrhea.  Most cases of acute diarrhea are due to infections with virus and bacteria and are self-limited conditions lasting less than 14 days.  For your symptoms you may take Imodium 2 mg tablets that are over the counter at your local pharmacy. Take two tablet now and then one after each loose stool up to 6 a day.  Antibiotics are not needed for most people with diarrhea.    HOME CARE  We recommend changing your diet to help with your symptoms for the next few days.  Drink plenty of fluids that contain water salt and sugar. Sports drinks such as Gatorade may help.   You may try broths, soups, bananas, applesauce, soft breads, mashed potatoes or crackers.   You are considered infectious for as long as the diarrhea continues. Hand washing or use of alcohol based hand sanitizers is recommend.  It is best to stay out of work or school until your symptoms stop.   GET HELP RIGHT AWAY  If you have dark yellow colored urine or do not pass urine frequently you should drink more fluids.    If your symptoms worsen   If you feel like you are going to pass out (faint)  You have a new problem  MAKE SURE YOU   Understand these instructions.  Will watch your condition.  Will get help right away if you are not doing well or get worse.  Your e-visit answers were reviewed by a board certified advanced clinical practitioner to complete your personal care plan.  Depending on the condition, your plan could have included both over the counter or prescription medications.  If there is a problem please reply  once you have received a response from your provider.  Your safety is important to us.  If you have drug allergies check your prescription carefully.    You can use MyChart to ask questions about today's visit, request a non-urgent call  back, or ask for a work or school excuse for 24 hours related to this e-Visit. If it has been greater than 24 hours you will need to follow up with your provider, or enter a new e-Visit to address those concerns.   You will get an e-mail in the next two days asking about your experience.  I hope that your e-visit has been valuable and will speed your recovery. Thank you for using e-visits.   Greater than 5 minutes, yet less than 10 minutes of time have been spent researching, coordinating, and implementing care for this patient today.  Thank you for the details you included in the comment boxes. Those details are very helpful in determining the best course of treatment for you and help us to provide the best care.  

## 2018-09-03 NOTE — Progress Notes (Signed)
E-Visit for Corona Virus Screening   Your current symptoms could be consistent with the coronavirus.  Many health care providers can now test patients at their office but not all are.  [place] has multiple testing sites. For information on our COVID testing locations and hours go to [email or web address]  Please quarantine yourself while awaiting your test results.  We are enrolling you in our MyChart Home Montioring for COVID19 . Daily you will receive a questionnaire within the MyChart website. Our COVID 19 response team willl be monitoriing your responses daily.    COVID-19 is a respiratory illness with symptoms that are similar to the flu. Symptoms are typically mild to moderate, but there have been cases of severe illness and death due to the virus. The following symptoms may appear 2-14 days after exposure: . Fever . Cough . Shortness of breath or difficulty breathing . Chills . Repeated shaking with chills . Muscle pain . Headache . Sore throat . New loss of taste or smell . Fatigue . Congestion or runny nose . Nausea or vomiting . Diarrhea  It is vitally important that if you feel that you have an infection such as this virus or any other virus that you stay home and away from places where you may spread it to others.  You should self-quarantine for 14 days if you have symptoms that could potentially be coronavirus or have been in close contact a with a person diagnosed with COVID-19 within the last 2 weeks. You should avoid contact with people age 65 and older.   You should wear a mask or cloth face covering over your nose and mouth if you must be around other people or animals, including pets (even at home). Try to stay at least 6 feet away from other people. This will protect the people around you.  You may also take acetaminophen (Tylenol) as needed for fever.   Reduce your risk of any infection by using the same precautions used for avoiding the  common cold or flu:  . Wash your hands often with soap and warm water for at least 20 seconds.  If soap and water are not readily available, use an alcohol-based hand sanitizer with at least 60% alcohol.  . If coughing or sneezing, cover your mouth and nose by coughing or sneezing into the elbow areas of your shirt or coat, into a tissue or into your sleeve (not your hands). . Avoid shaking hands with others and consider head nods or verbal greetings only. . Avoid touching your eyes, nose, or mouth with unwashed hands.  . Avoid close contact with people who are sick. . Avoid places or events with large numbers of people in one location, like concerts or sporting events. . Carefully consider travel plans you have or are making. . If you are planning any travel outside or inside the [place], visit the CDC's Travelers' Health webpage for the latest health notices. . If you have some symptoms but not all symptoms, continue to monitor at home and seek medical attention if your symptoms worsen. . If you are having a medical emergency, call 911.  HOME CARE . Only take medications as instructed by your medical team. . Drink plenty of fluids and get plenty of rest. . A steam or ultrasonic humidifier can help if you have congestion.   GET HELP RIGHT AWAY IF YOU HAVE EMERGENCY WARNING SIGNS** FOR COVID-19. If you or someone is showing any of these signs seek emergency medical care immediately. Call   911 or proceed to your closest emergency facility if: . You develop worsening high fever. . Trouble breathing . Bluish lips or face . Persistent pain or pressure in the chest . New confusion . Inability to wake or stay awake . You cough up blood. . Your symptoms become more severe  **This list is not all possible symptoms. Contact your medical provider for any symptoms that are sever or concerning to you.   MAKE SURE YOU   Understand these instructions.  Will watch your condition.  Will get help right  away if you are not doing well or get worse.  Your e-visit answers were reviewed by a board certified advanced clinical practitioner to complete your personal care plan.  Depending on the condition, your plan could have included both over the counter or prescription medications.  If there is a problem please reply once you have received a response from your provider.  Your safety is important to us.  If you have drug allergies check your prescription carefully.    You can use MyChart to ask questions about today's visit, request a non-urgent call back, or ask for a work or school excuse for 24 hours related to this e-Visit. If it has been greater than 24 hours you will need to follow up with your provider, or enter a new e-Visit to address those concerns. You will get an e-mail in the next two days asking about your experience.  I hope that your e-visit has been valuable and will speed your recovery. Thank you for using e-visits.   Greater than 5 minutes, yet less than 10 minutes of time have been spent researching, coordinating, and implementing care for this patient today.  Thank you for the details you included in the comment boxes. Those details are very helpful in determining the best course of treatment for you and help us to provide the best care.  

## 2018-10-15 ENCOUNTER — Telehealth: Payer: Self-pay

## 2018-10-15 ENCOUNTER — Other Ambulatory Visit: Payer: Self-pay

## 2018-10-15 ENCOUNTER — Encounter (INDEPENDENT_AMBULATORY_CARE_PROVIDER_SITE_OTHER): Payer: Self-pay

## 2018-10-15 ENCOUNTER — Telehealth: Payer: BC Managed Care – PPO | Admitting: Physician Assistant

## 2018-10-15 DIAGNOSIS — R05 Cough: Secondary | ICD-10-CM | POA: Diagnosis not present

## 2018-10-15 DIAGNOSIS — R059 Cough, unspecified: Secondary | ICD-10-CM

## 2018-10-15 DIAGNOSIS — Z20822 Contact with and (suspected) exposure to covid-19: Secondary | ICD-10-CM

## 2018-10-15 MED ORDER — BENZONATATE 100 MG PO CAPS: 100.0000 mg | ORAL_CAPSULE | Freq: Three times a day (TID) | ORAL | 0 refills | Status: DC | PRN

## 2018-10-15 NOTE — Telephone Encounter (Signed)
Per protocol for change in appetite patient advise to drink fluids as tolerated, work his way up to bland solid food such as crackers, pretzels, soup,bread or applesauce and boiled starches. Also if he is unable to tolerate any food or liquids, notify pcp. If he develops severe vomiting(more than 6 times a day and or >8hours) and /or severe abdominal pain. Advise patient to call 911 and seek treatment in ED.

## 2018-10-15 NOTE — Progress Notes (Signed)
E-Visit for Corona Virus Screening   Your current symptoms could be consistent with the coronavirus.  Many health care providers can now test patients at their office but not all are.  [facility] has multiple testing sites. For information on our COVID testing locations and hours go to [email or web address]  Please quarantine yourself while awaiting your test results.  We are enrolling you in our MyChart Home Montioring for COVID19 . Daily you will receive a questionnaire within the MyChart website. Our COVID 19 response team willl be monitoriing your responses daily.  Please read below for symptomatic treatment prescriptions and recommendations.   COVID-19 is a respiratory illness with symptoms that are similar to the flu. Symptoms are typically mild to moderate, but there have been cases of severe illness and death due to the virus. The following symptoms may appear 2-14 days after exposure: . Fever . Cough . Shortness of breath or difficulty breathing . Chills . Repeated shaking with chills . Muscle pain . Headache . Sore throat . New loss of taste or smell . Fatigue . Congestion or runny nose . Nausea or vomiting . Diarrhea  It is vitally important that if you feel that you have an infection such as this virus or any other virus that you stay home and away from places where you may spread it to others.  You should self-quarantine for 14 days if you have symptoms that could potentially be coronavirus or have been in close contact a with a person diagnosed with COVID-19 within the last 2 weeks. You should avoid contact with people age 62 and older.   You should wear a mask or cloth face covering over your nose and mouth if you must be around other people or animals, including pets (even at home). Try to stay at least 6 feet away from other people. This will protect the people around you.  You can use medication such as A prescription cough medication called  Tessalon Perles 100 mg. You may take 1-2 capsules every 8 hours as needed for cough  You may also take acetaminophen (Tylenol) as needed for fever.   -Cepacol throat lozenges. -Gargle with 8 oz of salt water ( tsp of salt per 1 qt of water) as often as every 1-2 hours to soothe your throat. -For sore throat try using a honey-based tea. Use 3 teaspoons of honey with juice squeezed from half lemon. Place shaved pieces of ginger into 1/2-1 cup of water and warm over stove top. Then mix the ingredients and repeat every 4 hours as needed. -Foods that can help speed recovery: honey, garlic, chicken soup, elderberries, green tea.   For your diarrhea symptoms you may take Imodium 2 mg tablets that are over the counter at your local pharmacy. Take two tablet now and then one after each loose stool up to 6 a day.  Antibiotics are not needed for most people with diarrhea.  HOME CARE  We recommend changing your diet to help with your symptoms for the next few days.  Drink plenty of fluids that contain water salt and sugar. Sports drinks such as Gatorade may help.   You may try broths, soups, bananas, applesauce, soft breads, mashed potatoes or crackers.   You are considered infectious for as long as the diarrhea continues. Hand washing or use of alcohol based hand sanitizers is recommend.  It is best to stay out of work or school until your symptoms stop.    Reduce your risk of any infection  by using the same precautions used for avoiding the common cold or flu:  Marland Kitchen Wash your hands often with soap and warm water for at least 20 seconds.  If soap and water are not readily available, use an alcohol-based hand sanitizer with at least 60% alcohol.  . If coughing or sneezing, cover your mouth and nose by coughing or sneezing into the elbow areas of your shirt or coat, into a tissue or into your sleeve (not your hands). . Avoid shaking hands with others and consider head nods or verbal greetings  only. . Avoid touching your eyes, nose, or mouth with unwashed hands.  . Avoid close contact with people who are sick. . Avoid places or events with large numbers of people in one location, like concerts or sporting events. . Carefully consider travel plans you have or are making. . If you are planning any travel outside or inside the [place], visit the CDC's Travelers' Health webpage for the latest health notices. . If you have some symptoms but not all symptoms, continue to monitor at home and seek medical attention if your symptoms worsen. . If you are having a medical emergency, call 911.  HOME CARE . Only take medications as instructed by your medical team. . Drink plenty of fluids and get plenty of rest. . A steam or ultrasonic humidifier can help if you have congestion.   GET HELP RIGHT AWAY IF YOU HAVE EMERGENCY WARNING SIGNS** FOR COVID-19. If you or someone is showing any of these signs seek emergency medical care immediately. Call 911 or proceed to your closest emergency facility if: . You develop worsening high fever. . Trouble breathing . Bluish lips or face . Persistent pain or pressure in the chest . New confusion . Inability to wake or stay awake . You cough up blood. . Your symptoms become more severe  **This list is not all possible symptoms. Contact your medical provider for any symptoms that are sever or concerning to you.   MAKE SURE YOU   Understand these instructions.  Will watch your condition.  Will get help right away if you are not doing well or get worse.  Your e-visit answers were reviewed by a board certified advanced clinical practitioner to complete your personal care plan.  Depending on the condition, your plan could have included both over the counter or prescription medications.  If there is a problem please reply once you have received a response from your provider.  Your safety is important to [place].  If you have drug allergies check your prescription  carefully.    You can use MyChart to ask questions about today's visit, request a non-urgent call back, or ask for a work or school excuse for 24 hours related to this e-Visit. If it has been greater than 24 hours you will need to follow up with your provider, or enter a new e-Visit to address those concerns. You will get an e-mail in the next two days asking about your experience.  I hope that your e-visit has been valuable and will speed your recovery. Thank you for using e-visits.   Greater than 5 minutes, yet less than 10 minutes of time have been spent researching, coordinating and implementing care for this patient today.

## 2018-10-16 ENCOUNTER — Encounter (INDEPENDENT_AMBULATORY_CARE_PROVIDER_SITE_OTHER): Payer: Self-pay

## 2018-10-16 LAB — NOVEL CORONAVIRUS, NAA: SARS-CoV-2, NAA: NOT DETECTED

## 2018-10-17 ENCOUNTER — Encounter (INDEPENDENT_AMBULATORY_CARE_PROVIDER_SITE_OTHER): Payer: Self-pay

## 2018-10-19 ENCOUNTER — Encounter (INDEPENDENT_AMBULATORY_CARE_PROVIDER_SITE_OTHER): Payer: Self-pay

## 2018-10-20 ENCOUNTER — Encounter: Payer: Self-pay | Admitting: Family

## 2018-10-20 ENCOUNTER — Encounter (INDEPENDENT_AMBULATORY_CARE_PROVIDER_SITE_OTHER): Payer: Self-pay

## 2018-10-21 ENCOUNTER — Encounter (INDEPENDENT_AMBULATORY_CARE_PROVIDER_SITE_OTHER): Payer: Self-pay

## 2018-10-22 ENCOUNTER — Encounter (INDEPENDENT_AMBULATORY_CARE_PROVIDER_SITE_OTHER): Payer: Self-pay

## 2018-10-23 ENCOUNTER — Encounter (INDEPENDENT_AMBULATORY_CARE_PROVIDER_SITE_OTHER): Payer: Self-pay

## 2018-10-24 ENCOUNTER — Encounter (INDEPENDENT_AMBULATORY_CARE_PROVIDER_SITE_OTHER): Payer: Self-pay

## 2018-10-25 ENCOUNTER — Encounter (INDEPENDENT_AMBULATORY_CARE_PROVIDER_SITE_OTHER): Payer: Self-pay

## 2018-10-26 ENCOUNTER — Encounter (INDEPENDENT_AMBULATORY_CARE_PROVIDER_SITE_OTHER): Payer: Self-pay

## 2018-10-27 ENCOUNTER — Encounter (INDEPENDENT_AMBULATORY_CARE_PROVIDER_SITE_OTHER): Payer: Self-pay

## 2018-11-03 ENCOUNTER — Telehealth: Payer: BC Managed Care – PPO | Admitting: Physician Assistant

## 2018-11-03 DIAGNOSIS — K529 Noninfective gastroenteritis and colitis, unspecified: Secondary | ICD-10-CM

## 2018-11-03 NOTE — Progress Notes (Signed)
I have spent 5 minutes in review of e-visit questionnaire, review and updating patient chart, medical decision making and response to patient.   Leighanna Kirn Cody Brynn Reznik, PA-C    

## 2018-11-03 NOTE — Progress Notes (Signed)
We are sorry that you are not feeling well.  Here is how we plan to help!  Based on what you have shared with me it looks like you have Acute Infectious Diarrhea.  Most cases of acute diarrhea are due to infections with virus and bacteria and are self-limited conditions lasting less than 14 days.  For your symptoms you may take Imodium 2 mg tablets that are over the counter at your local pharmacy. Take two tablet now and then one after each loose stool up to 6 a day.  Antibiotics are not needed for most people with diarrhea.   HOME CARE  We recommend changing your diet to help with your symptoms for the next few days.  Drink plenty of fluids that contain water salt and sugar. Sports drinks such as Gatorade may help.   You may try broths, soups, bananas, applesauce, soft breads, mashed potatoes or crackers.   You are considered infectious for as long as the diarrhea continues. Hand washing or use of alcohol based hand sanitizers is recommend.  It is best to stay out of work or school until your symptoms stop.   GET HELP RIGHT AWAY  If you have dark yellow colored urine or do not pass urine frequently you should drink more fluids.    If your symptoms worsen   If you feel like you are going to pass out (faint)  You have a new problem  MAKE SURE YOU   Understand these instructions.  Will watch your condition.  Will get help right away if you are not doing well or get worse.  Your e-visit answers were reviewed by a board certified advanced clinical practitioner to complete your personal care plan.  Depending on the condition, your plan could have included both over the counter or prescription medications.  If there is a problem please reply  once you have received a response from your provider.  Your safety is important to us.  If you have drug allergies check your prescription carefully.    You can use MyChart to ask questions about today's visit, request a non-urgent call  back, or ask for a work or school excuse for 24 hours related to this e-Visit. If it has been greater than 24 hours you will need to follow up with your provider, or enter a new e-Visit to address those concerns.   You will get an e-mail in the next two days asking about your experience.  I hope that your e-visit has been valuable and will speed your recovery. Thank you for using e-visits.  

## 2018-11-08 ENCOUNTER — Other Ambulatory Visit: Payer: Self-pay

## 2018-11-08 ENCOUNTER — Other Ambulatory Visit (HOSPITAL_COMMUNITY)
Admission: RE | Admit: 2018-11-08 | Discharge: 2018-11-08 | Disposition: A | Discharge status: Home / Self Care | Payer: BC Managed Care – PPO | Source: Ambulatory Visit | Attending: Infectious Disease | Admitting: Infectious Disease

## 2018-11-08 ENCOUNTER — Ambulatory Visit (INDEPENDENT_AMBULATORY_CARE_PROVIDER_SITE_OTHER): Payer: BC Managed Care – PPO | Admitting: Pharmacist

## 2018-11-08 DIAGNOSIS — Z7252 High risk homosexual behavior: Secondary | ICD-10-CM | POA: Insufficient documentation

## 2018-11-08 DIAGNOSIS — Z114 Encounter for screening for human immunodeficiency virus [HIV]: Secondary | ICD-10-CM | POA: Diagnosis not present

## 2018-11-08 NOTE — Progress Notes (Signed)
Date:  11/08/2018   HPI: Adrian Johnson is a 28 y.o. male who presents to the [facility] pharmacy clinic for PrEP follow-up.  Insured   [x]    Uninsured  []    Patient Active Problem List   Diagnosis Date Noted  . High risk sexual behavior 07/21/2016    Patient's Medications  New Prescriptions   No medications on file  Previous Medications   AMOXICILLIN (AMOXIL) 500 MG CAPSULE    Take 1 capsule (500 mg total) by mouth 2 (two) times daily.   BENZONATATE (TESSALON) 100 MG CAPSULE    Take 1-2 capsules (100-200 mg total) by mouth 3 (three) times daily as needed for cough.   CETIRIZINE (ZYRTEC) 10 MG TABLET    Take 1 tablet (10 mg total) by mouth daily.   FLUTICASONE (FLONASE) 50 MCG/ACT NASAL SPRAY    Place 2 sprays into both nostrils daily.   LORATADINE-PSEUDOEPHEDRINE (CLARITIN-D 24 HOUR) 10-240 MG 24 HR TABLET    Take 1 tablet by mouth daily.   PROMETHAZINE-DEXTROMETHORPHAN (PROMETHAZINE-DM) 6.25-15 MG/5ML SYRUP    Take 5 mLs by mouth 4 (four) times daily as needed for cough.  Modified Medications   No medications on file  Discontinued Medications   No medications on file    Allergies: No Known Allergies  Past Medical History: No past medical history on file.  Social History: Social History   Socioeconomic History  . Marital status: Single    Spouse name: Not on file  . Number of children: Not on file  . Years of education: Not on file  . Highest education level: Not on file  Occupational History  . Not on file  Social Needs  . Financial resource strain: Not on file  . Food insecurity    Worry: Not on file    Inability: Not on file  . Transportation needs    Medical: Not on file    Non-medical: Not on file  Tobacco Use  . Smoking status: Never Smoker  . Smokeless tobacco: Never Used  Substance and Sexual Activity  . Alcohol use: Yes  . Drug use: No  . Sexual activity: Not on file  Lifestyle  . Physical activity    Days per week: Not on file    Minutes per  session: Not on file  . Stress: Not on file  Relationships  . Social 09/21/2016 on phone: Not on file    Gets together: Not on file    Attends religious service: Not on file    Active member of club or organization: Not on file    Attends meetings of clubs or organizations: Not on file    Relationship status: Not on file  Other Topics Concern  . Not on file  Social History Narrative  . Not on file    [facility] HIV PREP FLOWSHEET RESULTS 11/08/2018 07/23/2017 04/26/2017 02/12/2017 11/20/2016 11/20/2016 08/18/2016  Insurance Status Insured Insured Insured Insured Insured Insured Insured  How did you hear? - Friends - - - - -  Gender at birth Male Male Male Male Male Male Male  Gender identity cis-Male cis-Male cis-Male cis-Male cis-Male cis-Male cis-Male  Risk for HIV - - - - Condomless vaginal or anal intercourse Condomless vaginal or anal intercourse In sexual relationship with HIV+ partner  Risk for HIV - - Condomless vaginal or anal intercourse Condomless vaginal or anal intercourse - - -  Sex Partners Men only Men only Men only Men only Men only Men only  Men only  # of sex partners - - - - 2 2 1   # sex partners past 3-6 mos 1-3 1-3 1-3 - - - -  Sex activity preferences Insertive and receptive;Oral Insertive and receptive;Oral Insertive and receptive;Oral - Insertive and receptive Insertive and receptive;Oral Insertive and receptive  Condom use Yes Yes Yes - Yes Yes Yes  % condom use 100 100 26 - 27 50 100  Partners genders and ages - M 20-24;M 25-29;M 59-49 M 20-24;M 25-29 - M 20-24;M 25-29 M 20-24;M 25-29 -  Treated for STI? No No No - - - -  HIV symptoms? N/A N/A Sore throat N/A - - -  PrEP Eligibility Substantial risk for HIV HIV negative;CrCl >60 ml/min;Substantial risk for HIV - - CrCl > 60 - HIV negative;No Symptoms of HIV;CrCl > 60  Paper work received? - No - - - - No    Labs:  SCr: Lab Results  Component Value Date   CREATININE 0.76 07/23/2017   CREATININE 0.83  02/12/2017   CREATININE 0.85 08/18/2016   CREATININE 0.91 07/21/2016   HIV Lab Results  Component Value Date   HIV NON-REACTIVE 01/28/2018   HIV NON-REACTIVE 07/23/2017   HIV NON-REACTIVE 04/26/2017   HIV NON-REACTIVE 02/12/2017   HIV NON-REACTIVE 11/20/2016   Hepatitis B Lab Results  Component Value Date   HEPBSAB REACTIVE (A) 07/23/2017   HEPBSAG NEGATIVE 07/21/2016   Hepatitis C No results found for: HEPCAB, HCVRNAPCRQN Hepatitis A Lab Results  Component Value Date   HAV REACTIVE (A) 07/21/2016   RPR and STI Lab Results  Component Value Date   LABRPR NON-REACTIVE 01/28/2018   LABRPR NON-REACTIVE 04/26/2017   LABRPR NON REAC 07/21/2016   LABRPR Non Reactive 03/30/2016   LABRPR NON REAC 11/11/2015    STI Results GC GC CT CT  01/28/2018 Negative - Negative -  01/28/2018 Negative - Negative -  01/28/2018 Negative - Negative -  04/26/2017 Negative - Negative -  04/26/2017 Negative - Negative -  04/26/2017 Negative - Negative -  11/20/2016 Negative - Negative -  11/20/2016 Negative - Negative -  11/20/2016 Negative - Negative -  07/21/2016 Negative - Negative -  07/21/2016 Negative - Negative -  07/21/2016 Negative - Negative -  03/30/2016 - - Negative -  11/11/2015 - NOT DETECTED - NOT DETECTED  09/23/2015 - NOT DETECTED - NOT DETECTED  12/17/2014 - NEGATIVE - NEGATIVE  12/31/2013 - NEGATIVE - NEGATIVE    Assessment: Adrian Johnson is here today to discuss restarting PrEP. He used to take it but decided to stop due to not being very sexually active lately.  He wishes to restart today as he thinks he may start dating again.  He was previously on Truvada, so I will switch him to Descovy today.  He has only had one partner since I saw him back in January and they used condoms. Will check all labs and see him back in 3 months.  He wishes to come back next week for a flu shot.  Plan: - HIV antibody, BMET, RPR, urine/oral/rectal gonorrhea/chlamydia cytology today - Descovy x 3 months if  HIV negative - F/u with me on 10/30 at 915am for a flu shot - F/u with me again 1/15 at 10am for PrEP   L. , PharmD, BCIDP, AAHIVP, [place] for [facility] 11/08/2018, 1:23 PM

## 2018-11-11 ENCOUNTER — Other Ambulatory Visit: Payer: Self-pay | Admitting: Pharmacist

## 2018-11-11 DIAGNOSIS — Z114 Encounter for screening for human immunodeficiency virus [HIV]: Secondary | ICD-10-CM

## 2018-11-11 DIAGNOSIS — Z7252 High risk homosexual behavior: Secondary | ICD-10-CM

## 2018-11-11 DIAGNOSIS — Z113 Encounter for screening for infections with a predominantly sexual mode of transmission: Secondary | ICD-10-CM

## 2018-11-11 LAB — BASIC METABOLIC PANEL
BUN: 12 mg/dL (ref 7–25)
CO2: 27 mmol/L (ref 20–32)
Calcium: 9.6 mg/dL (ref 8.6–10.3)
Chloride: 104 mmol/L (ref 98–110)
Creat: 0.94 mg/dL (ref 0.60–1.35)
Glucose, Bld: 90 mg/dL (ref 65–99)
Potassium: 4.3 mmol/L (ref 3.5–5.3)
Sodium: 140 mmol/L (ref 135–146)

## 2018-11-11 LAB — HIV ANTIBODY (ROUTINE TESTING W REFLEX): HIV 1&2 Ab, 4th Generation: NONREACTIVE

## 2018-11-11 LAB — RPR: RPR Ser Ql: NONREACTIVE

## 2018-11-11 MED ORDER — DESCOVY 200-25 MG PO TABS: 1.0000 | ORAL_TABLET | Freq: Every day | ORAL | 2 refills | Status: DC

## 2018-11-11 NOTE — Progress Notes (Signed)
Patient's HIV antibody is negative.  Will send in 3 months of Descovy to [facility] on [address].

## 2018-11-12 LAB — URINE CYTOLOGY ANCILLARY ONLY
Chlamydia: NEGATIVE
Comment: NEGATIVE
Comment: NORMAL
Neisseria Gonorrhea: NEGATIVE

## 2018-11-12 LAB — CYTOLOGY, (ORAL, ANAL, URETHRAL) ANCILLARY ONLY
Chlamydia: NEGATIVE
Chlamydia: NEGATIVE
Comment: NEGATIVE
Comment: NEGATIVE
Comment: NORMAL
Comment: NORMAL
Neisseria Gonorrhea: NEGATIVE
Neisseria Gonorrhea: NEGATIVE

## 2018-11-15 ENCOUNTER — Other Ambulatory Visit: Payer: Self-pay

## 2018-11-15 ENCOUNTER — Ambulatory Visit (INDEPENDENT_AMBULATORY_CARE_PROVIDER_SITE_OTHER): Payer: BC Managed Care – PPO | Admitting: Pharmacist

## 2018-11-15 ENCOUNTER — Encounter: Payer: Self-pay | Admitting: Pharmacist

## 2018-11-15 DIAGNOSIS — B20 Human immunodeficiency virus [HIV] disease: Secondary | ICD-10-CM | POA: Diagnosis not present

## 2018-11-15 DIAGNOSIS — Z20822 Contact with and (suspected) exposure to covid-19: Secondary | ICD-10-CM

## 2018-11-15 DIAGNOSIS — Z23 Encounter for immunization: Secondary | ICD-10-CM

## 2018-11-15 NOTE — Progress Notes (Signed)
   [facility] for [facility] Pharmacy Vaccination Visit  HPI: Adrian Johnson is a 28 y.o. male who presents for his flu shot.  Hepatitis B Lab Results  Component Value Date   HEPBSAB REACTIVE (A) 07/23/2017   Lab Results  Component Value Date   HEPBSAG NEGATIVE 07/21/2016    Hepatitis C Lab Results  Component Value Date   HCVAB NEGATIVE 07/21/2016    Hepatitis A Lab Results  Component Value Date   HAV REACTIVE (A) 07/21/2016    Assessment: Flu shot administered today.  Patient also states that he had recent contact with a COVID positive patient. He had lunch with a friend on Monday and that same friend tested positive yesterday. He has no symptoms at this time.  Discussed with Marya Amsler. Told patient to go to [facility] to get tested and to shelter in place for 10-14 days.  If he develops symptoms, he will do a e-visit with [facility].  Gave patient a note for work.   Plan: - Flu shot - Shelter in place  Cassie L. Kuppelweiser, PharmD, BCIDP, AAHIVP, [place] for Infectious Disease 11/15/2018, 3:12 PM

## 2018-11-16 LAB — NOVEL CORONAVIRUS, NAA: SARS-CoV-2, NAA: NOT DETECTED

## 2018-11-18 ENCOUNTER — Telehealth: Payer: BC Managed Care – PPO | Admitting: Physician Assistant

## 2018-11-19 ENCOUNTER — Encounter: Payer: Self-pay | Admitting: Pharmacist

## 2018-11-20 ENCOUNTER — Telehealth: Payer: Self-pay

## 2018-11-20 NOTE — Telephone Encounter (Signed)
I discussed this with Marya Amsler, so I will forward to him as he knows much more about this than I do.   Marya Amsler - sorry to get you involved!

## 2018-11-20 NOTE — Telephone Encounter (Signed)
Received a call today from [facility] regarding letter that office provided patient. HR would like to discuss why patient would need to quarantine 14 days even with negative Covid-19 test. Will forward message to [facility], Pharmacist for more information. Ivin Booty, [place]: [place], [place]

## 2018-11-20 NOTE — Telephone Encounter (Signed)
Called [facility] from HR to relay Luz Brazen, [place] message regarding covid letter. Ivin Booty does not have any more questions at this time. [place]

## 2018-11-20 NOTE — Telephone Encounter (Signed)
Per the current CDC guidelines anyone with direct exposure to an individual with a positive coronavirus test, even if the individual tests negative and has no symptoms, should remain in self-quarantine for 14 days from the day of last exposure which would be Monday, November 9th.

## 2018-11-21 NOTE — Telephone Encounter (Signed)
Thanks Greg

## 2018-11-26 ENCOUNTER — Telehealth: Payer: BC Managed Care – PPO | Admitting: Physician Assistant

## 2018-11-26 DIAGNOSIS — J309 Allergic rhinitis, unspecified: Secondary | ICD-10-CM | POA: Diagnosis not present

## 2018-11-26 MED ORDER — FLUTICASONE PROPIONATE 50 MCG/ACT NA SUSP: 2.0000 | Freq: Every day | NASAL | 6 refills | Status: DC

## 2018-11-26 NOTE — Progress Notes (Signed)
E visit for Allergic Rhinitis We are sorry that you are not feeling well.  Here is how we plan to help!  Based on what you have shared with me it looks like you have Allergic Rhinitis.  Rhinitis is when a reaction occurs that causes nasal congestion, runny nose, sneezing, and itching.  Most types of rhinitis are caused by an inflammation and are associated with symptoms in the eyes ears or throat. There are several types of rhinitis.  The most common are acute rhinitis, which is usually caused by a viral illness, allergic or seasonal rhinitis, and nonallergic or year-round rhinitis.  Nasal allergies occur certain times of the year.  Allergic rhinitis is caused when allergens in the air trigger the release of histamine in the body.  Histamine causes itching, swelling, and fluid to build up in the fragile linings of the nasal passages, sinuses and eyelids.  An itchy nose and clear discharge are common.  I recommend the following over the counter treatments: Allegra 60 mg twice daily  I also would recommend a nasal spray: Flonase 2 sprays into each nostril once daily and Saline 1 spray into each nostril as needed  You may also benefit from eye drops such as: Systane 1-2 driops each eye twice daily as needed  HOME CARE:   You can use an over-the-counter saline nasal spray as needed  Avoid areas where there is heavy dust, mites, or molds  Stay indoors on windy days during the pollen season  Keep windows closed in home, at least in bedroom; use air conditioner.  Use high-efficiency house air filter  Keep windows closed in car, turn AC on re-circulate  Avoid playing out with dog during pollen season  GET HELP RIGHT AWAY IF:   If your symptoms do not improve within 10 days  You become short of breath  You develop yellow or green discharge from your nose for over 3 days  You have coughing fits  MAKE SURE YOU:   Understand these instructions  Will watch your condition  Will  get help right away if you are not doing well or get worse  Thank you for choosing an e-visit. Your e-visit answers were reviewed by a board certified advanced clinical practitioner to complete your personal care plan. Depending upon the condition, your plan could have included both over the counter or prescription medications. Please review your pharmacy choice. Be sure that the pharmacy you have chosen is open so that you can pick up your prescription now.  If there is a problem you may message your provider in [place] to have the prescription routed to another pharmacy. Your safety is important to [place]. If you have drug allergies check your prescription carefully.  For the next 24 hours, you can use MyChart to ask questions about today's visit, request a non-urgent call back, or ask for a work or school excuse from your e-visit provider. You will get an email in the next two days asking about your experience. I hope that your e-visit has been valuable and will speed your recovery.     Greater than 5 minutes, yet less than 10 minutes of time have been spent researching, coordinating and implementing care for this patient today.

## 2018-12-09 ENCOUNTER — Telehealth: Payer: BC Managed Care – PPO | Admitting: Physician Assistant

## 2018-12-09 ENCOUNTER — Other Ambulatory Visit: Payer: Self-pay

## 2018-12-09 DIAGNOSIS — Z20822 Contact with and (suspected) exposure to covid-19: Secondary | ICD-10-CM

## 2018-12-09 DIAGNOSIS — Z20828 Contact with and (suspected) exposure to other viral communicable diseases: Secondary | ICD-10-CM

## 2018-12-09 NOTE — Progress Notes (Signed)
E-Visit for Corona Virus Screening   Your current symptoms could be consistent with the coronavirus.  Many health care providers can now test patients at their office but not all are.   has multiple testing sites. For information on our COVID testing locations and hours go to [email or web address]  Please quarantine yourself while awaiting your test results.  We are enrolling you in our [place] for [place] . Daily you will receive a questionnaire within the [place] website. Our COVID 19 response team willl be monitoriing your responses daily. Please continue good preventive care measures, including:  frequent hand-washing, avoid touching your face, cover coughs/sneezes, stay out of crowds and keep a 6 foot distance from others.    COVID-19 is a respiratory illness with symptoms that are similar to the flu. Symptoms are typically mild to moderate, but there have been cases of severe illness and death due to the virus. The following symptoms may appear 2-14 days after exposure: . Fever . Cough . Shortness of breath or difficulty breathing . Chills . Repeated shaking with chills . Muscle pain . Headache . Sore throat . New loss of taste or smell . Fatigue . Congestion or runny nose . Nausea or vomiting . Diarrhea  If you develop fever/cough/breathlessness, please stay home for 10 days with improving symptoms and until you have had 24 hours of no fever (without taking a fever reducer).  Go to the nearest hospital ED for assessment if fever/cough/breathlessness are severe or illness seems like a threat to life.  It is vitally important that if you feel that you have an infection such as this virus or any other virus that you stay home and away from places where you may spread it to others.  You should avoid contact with people age 24 and older.   You should wear a mask or cloth face covering over your nose and mouth if you must be around other  people or animals, including pets (even at home). Try to stay at least 6 feet away from other people. This will protect the people around you.  You can use medication such as over the counter cough and cold medicine  You may also take acetaminophen (Tylenol) as needed for fever.   Reduce your risk of any infection by using the same precautions used for avoiding the common cold or flu:  Marland Kitchen Wash your hands often with soap and warm water for at least 20 seconds.  If soap and water are not readily available, use an alcohol-based hand sanitizer with at least 60% alcohol.  . If coughing or sneezing, cover your mouth and nose by coughing or sneezing into the elbow areas of your shirt or coat, into a tissue or into your sleeve (not your hands). . Avoid shaking hands with others and consider head nods or verbal greetings only. . Avoid touching your eyes, nose, or mouth with unwashed hands.  . Avoid close contact with people who are sick. . Avoid places or events with large numbers of people in one location, like concerts or sporting events. . Carefully consider travel plans you have or are making. . If you are planning any travel outside or inside the [place], visit the CDC's Travelers' Health webpage for the latest health notices. . If you have some symptoms but not all symptoms, continue to monitor at home and seek medical attention if your symptoms worsen. . If you are having a medical emergency, call 911.  HOME CARE . Only take  medications as instructed by your medical team. . Drink plenty of fluids and get plenty of rest. . A steam or ultrasonic humidifier can help if you have congestion.   GET HELP RIGHT AWAY IF YOU HAVE EMERGENCY WARNING SIGNS** FOR COVID-19. If you or someone is showing any of these signs seek emergency medical care immediately. Call 911 or proceed to your closest emergency facility if: . You develop worsening high fever. . Trouble breathing . Bluish lips or face . Persistent  pain or pressure in the chest . New confusion . Inability to wake or stay awake . You cough up blood. . Your symptoms become more severe  **This list is not all possible symptoms. Contact your medical provider for any symptoms that are sever or concerning to you.   MAKE SURE YOU   Understand these instructions.  Will watch your condition.  Will get help right away if you are not doing well or get worse.  Your e-visit answers were reviewed by a board certified advanced clinical practitioner to complete your personal care plan.  Depending on the condition, your plan could have included both over the counter or prescription medications.  If there is a problem please reply once you have received a response from your provider.  Your safety is important to us.  If you have drug allergies check your prescription carefully.    You can use MyChart to ask questions about today's visit, request a non-urgent call back, or ask for a work or school excuse for 24 hours related to this e-Visit. If it has been greater than 24 hours you will need to follow up with your provider, or enter a new e-Visit to address those concerns. You will get an e-mail in the next two days asking about your experience.  I hope that your e-visit has been valuable and will speed your recovery. Thank you for using e-visits.   5 minutes spent on this chart 

## 2018-12-10 LAB — NOVEL CORONAVIRUS, NAA: SARS-CoV-2, NAA: NOT DETECTED

## 2018-12-17 ENCOUNTER — Other Ambulatory Visit: Payer: Self-pay

## 2018-12-17 ENCOUNTER — Telehealth: Payer: BC Managed Care – PPO | Admitting: Physician Assistant

## 2018-12-17 DIAGNOSIS — Z20822 Contact with and (suspected) exposure to covid-19: Secondary | ICD-10-CM

## 2018-12-17 DIAGNOSIS — R05 Cough: Secondary | ICD-10-CM

## 2018-12-17 DIAGNOSIS — R059 Cough, unspecified: Secondary | ICD-10-CM

## 2018-12-17 MED ORDER — BENZONATATE 100 MG PO CAPS: 100.0000 mg | ORAL_CAPSULE | Freq: Three times a day (TID) | ORAL | 0 refills | Status: DC | PRN

## 2018-12-17 NOTE — Progress Notes (Signed)
E-Visit for Corona Virus Screening   Your current symptoms could be consistent with the coronavirus.  Many health care providers can now test patients at their office but not all are.  [facility] has multiple testing sites - you do not need a lab order or an appointment. For information on our COVID testing locations and hours go to [email or web address]  Please quarantine yourself while awaiting your test results.  We are enrolling you in our [place] for [place] . Daily you will receive a questionnaire within the [place] website. Our COVID 19 response team willl be monitoriing your responses daily. Please continue good preventive care measures, including:  frequent hand-washing, avoid touching your face, cover coughs/sneezes, stay out of crowds and keep a 6 foot distance from others.    COVID-19 is a respiratory illness with symptoms that are similar to the flu. Symptoms are typically mild to moderate, but there have been cases of severe illness and death due to the virus. The following symptoms may appear 2-14 days after exposure: . Fever . Cough . Shortness of breath or difficulty breathing . Chills . Repeated shaking with chills . Muscle pain . Headache . Sore throat . New loss of taste or smell . Fatigue . Congestion or runny nose . Nausea or vomiting . Diarrhea  If you develop fever/cough/breathlessness, please stay home for 10 days with improving symptoms and until you have had 24 hours of no fever (without taking a fever reducer).  Go to the nearest hospital ED for assessment if fever/cough/breathlessness are severe or illness seems like a threat to life.  It is vitally important that if you feel that you have an infection such as this virus or any other virus that you stay home and away from places where you may spread it to others.  You should avoid contact with people age 109 and older.   You should wear a mask or cloth face covering over  your nose and mouth if you must be around other people or animals, including pets (even at home). Try to stay at least 6 feet away from other people. This will protect the people around you.  You can use medication such as A prescription cough medication called Tessalon Perles 100 mg. You may take 1-2 capsules every 8 hours as needed for cough  You may also take acetaminophen (Tylenol) as needed for fever.   Reduce your risk of any infection by using the same precautions used for avoiding the common cold or flu:  Marland Kitchen Wash your hands often with soap and warm water for at least 20 seconds.  If soap and water are not readily available, use an alcohol-based hand sanitizer with at least 60% alcohol.  . If coughing or sneezing, cover your mouth and nose by coughing or sneezing into the elbow areas of your shirt or coat, into a tissue or into your sleeve (not your hands). . Avoid shaking hands with others and consider head nods or verbal greetings only. . Avoid touching your eyes, nose, or mouth with unwashed hands.  . Avoid close contact with people who are sick. . Avoid places or events with large numbers of people in one location, like concerts or sporting events. . Carefully consider travel plans you have or are making. . If you are planning any travel outside or inside the [place], visit the CDC's Travelers' Health webpage for the latest health notices. . If you have some symptoms but not all symptoms, continue to monitor at  home and seek medical attention if your symptoms worsen. . If you are having a medical emergency, call 911.  HOME CARE . Only take medications as instructed by your medical team. . Drink plenty of fluids and get plenty of rest. . A steam or ultrasonic humidifier can help if you have congestion.   GET HELP RIGHT AWAY IF YOU HAVE EMERGENCY WARNING SIGNS** FOR COVID-19. If you or someone is showing any of these signs seek emergency medical care immediately. Call 911 or proceed to  your closest emergency facility if: . You develop worsening high fever. . Trouble breathing . Bluish lips or face . Persistent pain or pressure in the chest . New confusion . Inability to wake or stay awake . You cough up blood. . Your symptoms become more severe  **This list is not all possible symptoms. Contact your medical provider for any symptoms that are sever or concerning to you.   MAKE SURE YOU   Understand these instructions.  Will watch your condition.  Will get help right away if you are not doing well or get worse.  Your e-visit answers were reviewed by a board certified advanced clinical practitioner to complete your personal care plan.  Depending on the condition, your plan could have included both over the counter or prescription medications.  If there is a problem please reply once you have received a response from your provider.  Your safety is important to [place].  If you have drug allergies check your prescription carefully.    You can use MyChart to ask questions about today's visit, request a non-urgent call back, or ask for a work or school excuse for 24 hours related to this e-Visit. If it has been greater than 24 hours you will need to follow up with your provider, or enter a new e-Visit to address those concerns. You will get an e-mail in the next two days asking about your experience.  I hope that your e-visit has been valuable and will speed your recovery. Thank you for using e-visits.   Greater than 5 minutes, yet less than 10 minutes of time have been spent researching, coordinating and implementing care for this patient today.

## 2018-12-18 ENCOUNTER — Encounter (INDEPENDENT_AMBULATORY_CARE_PROVIDER_SITE_OTHER): Payer: Self-pay

## 2018-12-19 ENCOUNTER — Encounter (INDEPENDENT_AMBULATORY_CARE_PROVIDER_SITE_OTHER): Payer: Self-pay

## 2018-12-20 ENCOUNTER — Encounter (INDEPENDENT_AMBULATORY_CARE_PROVIDER_SITE_OTHER): Payer: Self-pay

## 2018-12-20 LAB — NOVEL CORONAVIRUS, NAA: SARS-CoV-2, NAA: NOT DETECTED

## 2018-12-21 ENCOUNTER — Encounter (INDEPENDENT_AMBULATORY_CARE_PROVIDER_SITE_OTHER): Payer: Self-pay

## 2018-12-22 ENCOUNTER — Telehealth: Payer: Self-pay

## 2018-12-22 ENCOUNTER — Encounter (INDEPENDENT_AMBULATORY_CARE_PROVIDER_SITE_OTHER): Payer: Self-pay

## 2018-12-22 NOTE — Telephone Encounter (Signed)
Pain in chest noted last night. Occasional cough, congested cough. Afebrile. Non productive cough. C/P center of chest "soreness". Hurting with movement. Pt stated pain is now gone. Has taken Advil with good relief. No radiating. Pt advised to call 911 if pain worsens or becomes SOB, respiratory distress, or radiating pain. Pt verbalized understanding.

## 2018-12-23 ENCOUNTER — Encounter (INDEPENDENT_AMBULATORY_CARE_PROVIDER_SITE_OTHER): Payer: Self-pay

## 2018-12-24 ENCOUNTER — Encounter (INDEPENDENT_AMBULATORY_CARE_PROVIDER_SITE_OTHER): Payer: Self-pay

## 2018-12-25 ENCOUNTER — Encounter (INDEPENDENT_AMBULATORY_CARE_PROVIDER_SITE_OTHER): Payer: Self-pay

## 2018-12-26 ENCOUNTER — Encounter (INDEPENDENT_AMBULATORY_CARE_PROVIDER_SITE_OTHER): Payer: Self-pay

## 2018-12-27 ENCOUNTER — Encounter (INDEPENDENT_AMBULATORY_CARE_PROVIDER_SITE_OTHER): Payer: Self-pay

## 2018-12-30 ENCOUNTER — Encounter (INDEPENDENT_AMBULATORY_CARE_PROVIDER_SITE_OTHER): Payer: Self-pay

## 2019-01-27 ENCOUNTER — Encounter: Payer: Self-pay | Admitting: Pharmacist

## 2019-01-27 ENCOUNTER — Other Ambulatory Visit: Payer: Self-pay | Admitting: Pharmacist

## 2019-01-27 DIAGNOSIS — Z79899 Other long term (current) drug therapy: Secondary | ICD-10-CM

## 2019-01-31 ENCOUNTER — Other Ambulatory Visit: Payer: BC Managed Care – PPO

## 2019-01-31 ENCOUNTER — Other Ambulatory Visit: Payer: Self-pay

## 2019-01-31 ENCOUNTER — Ambulatory Visit: Payer: BC Managed Care – PPO | Admitting: Pharmacist

## 2019-01-31 DIAGNOSIS — Z79899 Other long term (current) drug therapy: Secondary | ICD-10-CM | POA: Diagnosis not present

## 2019-02-01 LAB — HIV ANTIBODY (ROUTINE TESTING W REFLEX): HIV 1&2 Ab, 4th Generation: NONREACTIVE

## 2019-02-03 ENCOUNTER — Other Ambulatory Visit: Payer: Self-pay | Admitting: Pharmacist

## 2019-02-03 ENCOUNTER — Encounter: Payer: Self-pay | Admitting: Pharmacist

## 2019-02-03 DIAGNOSIS — Z79899 Other long term (current) drug therapy: Secondary | ICD-10-CM

## 2019-02-03 DIAGNOSIS — Z7252 High risk homosexual behavior: Secondary | ICD-10-CM

## 2019-02-03 MED ORDER — DESCOVY 200-25 MG PO TABS: 1.0000 | ORAL_TABLET | Freq: Every day | ORAL | 2 refills | Status: DC

## 2019-02-11 ENCOUNTER — Telehealth: Payer: BC Managed Care – PPO | Admitting: Physician Assistant

## 2019-02-11 DIAGNOSIS — Z20822 Contact with and (suspected) exposure to covid-19: Secondary | ICD-10-CM

## 2019-02-11 NOTE — Progress Notes (Signed)
[facility] has multiple testing sites. For information on our COVID testing locations and hours go to [email or web address]   Testing Information: The COVID-19 Community Testing sites will begin testing BY APPOINTMENT ONLY.  You can schedule online at [email or web address]  If you do not have access to a smart phone or computer you may call [phone] for an appointment.   Additional testing sites in the Community:  . For [facility] Testing sites in [facility]  [email or web address]  . For Pop-up testing sites in [place]  [email or web address]  . For Testing sites with regular hours [email or web address]  . For [facility] [email or web address]  . For [facility] [email or web address]  . For [facility] testing in [place] and [employer] [email or web address]  . For [facility] testing in [facility]   [email or web address]  For  more information about community testing call [phone]   Please quarantine yourself while awaiting your test results. Please stay home for a minimum of 10 days from the first day of illness with improving symptoms and you have had 24 hours of no fever (without the use of Tylenol (Acetaminophen) Motrin (Ibuprofen) or any fever reducing medication).  Also - Do not get tested prior to returning to work because once you have had a positive test the test can stay positive for more then a month in some cases.   You should wear a mask or cloth face covering over your nose and mouth if you must be around other people or animals, including pets (even at home). Try to stay at least 6  feet away from other people. This will protect the people around you.  Please continue good preventive care measures, including:  frequent hand-washing, avoid touching your face, cover coughs/sneezes, stay out of crowds and keep a 6 foot distance from others.  COVID-19 is a respiratory illness with symptoms that are similar to the flu. Symptoms are typically mild to moderate, but there have been cases of severe illness and death due to the virus.   The following symptoms may appear 2-14 days after exposure: . Fever . Cough . Shortness of breath or difficulty breathing . Chills . Repeated shaking with chills . Muscle pain . Headache . Sore throat . New loss of taste or smell . Fatigue . Congestion or runny nose . Nausea or vomiting . Diarrhea  Go to the nearest hospital ED for assessment if fever/cough/breathlessness are severe or illness seems like a threat to life.  It is vitally important that if you feel that you have an infection such as this virus or any other virus that you stay home and away from places where you may spread it to others.  You should avoid contact with people age 92 and older.   You may also take acetaminophen (Tylenol) as needed for fever.  Reduce your risk of any infection by using the same precautions used for avoiding the common cold or flu:  Marland Kitchen Wash your hands often with soap and warm water for at least 20 seconds.  If soap and water are not readily available, use an alcohol-based hand sanitizer with at least 60% alcohol.  . If coughing or sneezing, cover your mouth and nose by coughing or sneezing into the elbow areas of your shirt or coat, into a tissue or into your sleeve (not your hands). . Avoid shaking hands with others and consider head nods or verbal greetings only. . Avoid touching your eyes, nose, or mouth with unwashed hands.  . Avoid close  contact with people who are sick. . Avoid places or events with large numbers of people in one location, like  concerts or sporting events. . Carefully consider travel plans you have or are making. . If you are planning any travel outside or inside the [place], visit the CDC's Travelers' Health webpage for the latest health notices. . If you have some symptoms but not all symptoms, continue to monitor at home and seek medical attention if your symptoms worsen. . If you are having a medical emergency, call 911.  HOME CARE . Only take medications as instructed by your medical team. . Drink plenty of fluids and get plenty of rest. . A steam or ultrasonic humidifier can help if you have congestion.   GET HELP RIGHT AWAY IF YOU HAVE EMERGENCY WARNING SIGNS** FOR COVID-19. If you or someone is showing any of these signs seek emergency medical care immediately. Call 911 or proceed to your closest emergency facility if: . You develop worsening high fever. . Trouble breathing . Bluish lips or face . Persistent pain or pressure in the chest . New confusion . Inability to wake or stay awake . You cough up blood. . Your symptoms become more severe  **This list is not all possible symptoms. Contact your medical provider for any symptoms that are sever or concerning to you.  MAKE SURE YOU   Understand these instructions.  Will watch your condition.  Will get help right away if you are not doing well or get worse.  Your e-visit answers were reviewed by a board certified advanced clinical practitioner to complete your personal care plan.  Depending on the condition, your plan could have included both over the counter or prescription medications.  If there is a problem please reply once you have received a response from your provider.  Your safety is important to [place].  If you have drug allergies check your prescription carefully.    You can use MyChart to ask questions about today's visit, request a non-urgent call back, or ask for a work or school excuse for 24 hours related to this e-Visit. If it has been  greater than 24 hours you will need to follow up with your provider, or enter a new e-Visit to address those concerns. You will get an e-mail in the next two days asking about your experience.  I hope that your e-visit has been valuable and will speed your recovery. Thank you for using e-visits.   Greater than 5 minutes, yet less than 10 minutes of time have been spent researching, coordinating and implementing care for this patient today.

## 2019-02-12 ENCOUNTER — Ambulatory Visit: Payer: BC Managed Care – PPO | Attending: Internal Medicine

## 2019-02-12 DIAGNOSIS — Z20822 Contact with and (suspected) exposure to covid-19: Secondary | ICD-10-CM | POA: Diagnosis not present

## 2019-02-13 LAB — NOVEL CORONAVIRUS, NAA: SARS-CoV-2, NAA: NOT DETECTED

## 2019-02-21 ENCOUNTER — Ambulatory Visit: Payer: BC Managed Care – PPO | Attending: Internal Medicine

## 2019-02-21 DIAGNOSIS — Z20822 Contact with and (suspected) exposure to covid-19: Secondary | ICD-10-CM | POA: Diagnosis not present

## 2019-02-23 LAB — NOVEL CORONAVIRUS, NAA: SARS-CoV-2, NAA: NOT DETECTED

## 2019-03-12 ENCOUNTER — Telehealth: Payer: Self-pay | Admitting: Pharmacy Technician

## 2019-03-12 NOTE — Telephone Encounter (Signed)
[facility] Patient Advocate Encounter   Was successful in obtaining a [employer] copay card for [place]. This copay card will make the patients copay $0.  I have spoken with the patient and he will pick up at [facility].    The billing information is.  RxBin: [id number] PCN: [id number] Member ID: [id number] Group ID: [id number]    Beulah Gandy, CPhT Specialty Pharmacy Patient [facility] Phone: [phone] Fax: [phone] 03/12/2019 2:40 PM

## 2019-04-01 ENCOUNTER — Telehealth: Payer: BC Managed Care – PPO | Admitting: Physician Assistant

## 2019-04-01 DIAGNOSIS — J45909 Unspecified asthma, uncomplicated: Secondary | ICD-10-CM

## 2019-04-01 MED ORDER — ALBUTEROL SULFATE HFA 108 (90 BASE) MCG/ACT IN AERS: 2.0000 | INHALATION_SPRAY | RESPIRATORY_TRACT | 0 refills | Status: DC | PRN

## 2019-04-01 NOTE — Progress Notes (Signed)
Based on what you shared with me, I feel your concerns cannot be handled via a connected care visit.     I recommend that you be seen for a face to face visit.  I understand that you do not currently have a PCP, but work restrictions and/or return to work clearance needs to be handled by a primary care provider.  I will prescribe an albuterol inhaler for you to use as needed for shortness of breath or wheezing.  If you do not have a PCP, [facility] offers a free physician referral service available at [phone]. Our trained staff has the experience, knowledge and resources to put you in touch with a physician who is right for you.   You also have the option of a video visit through [email or web address]  If you are having a true medical emergency please call 911.  NOTE: If you entered your credit card information for this eVisit, you will not be charged. You may see a "hold" on your card for the $35 but that hold will drop off and you will not have a charge processed.  Your e-visit answers were reviewed by a board certified advanced clinical practitioner to complete your personal care plan.  Thank you for using e-Visits.  Greater than 5 minutes, yet less than 10 minutes of time have been spent researching, coordinating, and implementing care for this patient today

## 2019-04-14 ENCOUNTER — Telehealth: Payer: BC Managed Care – PPO | Admitting: Emergency Medicine

## 2019-04-14 DIAGNOSIS — Z20822 Contact with and (suspected) exposure to covid-19: Secondary | ICD-10-CM

## 2019-04-14 DIAGNOSIS — Z20828 Contact with and (suspected) exposure to other viral communicable diseases: Secondary | ICD-10-CM | POA: Diagnosis not present

## 2019-04-14 DIAGNOSIS — Z03818 Encounter for observation for suspected exposure to other biological agents ruled out: Secondary | ICD-10-CM | POA: Diagnosis not present

## 2019-04-14 MED ORDER — BENZONATATE 100 MG PO CAPS: 100.0000 mg | ORAL_CAPSULE | Freq: Two times a day (BID) | ORAL | 0 refills | Status: DC | PRN

## 2019-04-14 NOTE — Progress Notes (Signed)
E-Visit for Corona Virus Screening  Your current symptoms could be consistent with the coronavirus.  Many health care providers can now test patients at their office but not all are.  [facility] has multiple testing sites. For information on our [place] testing locations and hours go to [email or web address]  We are enrolling you in our [place] [facility] . Daily you will receive a questionnaire within the [facility] website. Our COVID 19 response team will be monitoring your responses daily.  Testing Information: The COVID-19 Community Testing sites will begin testing BY APPOINTMENT ONLY.  You can schedule online at [email or web address]  If you do not have access to a smart phone or computer you may call [phone] for an appointment.   Additional testing sites in the Community:  . For [facility] Testing sites in [place] [facility]  FaceUpdate.uy  . For Pop-up testing sites in [place]  BowlDirectory.co.uk  . For Testing sites with regular hours [email or web address]  . For [place] [email or web address]  . For [facility] [email or web address]  . For [facility] testing in [place] and [employer] [email or web address]  . For [facility] testing in [facility]   [email or web address]  For  more information about community testing call [phone]   Please quarantine yourself while awaiting your test results. Please stay home for a minimum of 10 days from the first day of illness with improving symptoms and you have had 24 hours of no fever (without the use of Tylenol (Acetaminophen)  Motrin (Ibuprofen) or any fever reducing medication).  Also - Do not get tested prior to returning to work because once you have had a positive test the test can stay positive for more then a month in some cases.   You should wear a mask or cloth face covering over your nose and mouth if you must be around other people or animals, including pets (even at home). Try to stay at least 6 feet away from other people. This will protect the people around you.  Please continue good preventive care measures, including:  frequent hand-washing, avoid touching your face, cover coughs/sneezes, stay out of crowds and keep a 6 foot distance from others.  COVID-19 is a respiratory illness with symptoms that are similar to the flu. Symptoms are typically mild to moderate, but there have been cases of severe illness and death due to the virus.   The following symptoms may appear 2-14 days after exposure: . Fever . Cough . Shortness of breath or difficulty breathing . Chills . Repeated shaking with chills . Muscle pain . Headache . Sore throat . New loss of taste or smell . Fatigue . Congestion or runny nose . Nausea or vomiting . Diarrhea  Go to the nearest hospital ED for assessment if fever/cough/breathlessness are severe or illness seems like a threat to life.  It is vitally important that if you feel that you have an infection such as this virus or any other virus that you stay home and away from places where you may spread it to others.  You should avoid contact with people age 80 and older.   You can use medication such as A prescription cough medication called Tessalon Perles 100 mg. You may take 1-2 capsules every 8 hours as needed for cough   I've sent a work note in [employer].  You may also take acetaminophen (Tylenol) as needed for fever.  Reduce your risk of any infection by using the same precautions used for avoiding the  common cold or flu:  Marland Kitchen Wash your hands often with soap and warm water for  at least 20 seconds.  If soap and water are not readily available, use an alcohol-based hand sanitizer with at least 60% alcohol.  . If coughing or sneezing, cover your mouth and nose by coughing or sneezing into the elbow areas of your shirt or coat, into a tissue or into your sleeve (not your hands). . Avoid shaking hands with others and consider head nods or verbal greetings only. . Avoid touching your eyes, nose, or mouth with unwashed hands.  . Avoid close contact with people who are sick. . Avoid places or events with large numbers of people in one location, like concerts or sporting events. . Carefully consider travel plans you have or are making. . If you are planning any travel outside or inside the [place], visit the CDC's Travelers' Health webpage for the latest health notices. . If you have some symptoms but not all symptoms, continue to monitor at home and seek medical attention if your symptoms worsen. . If you are having a medical emergency, call 911.  HOME CARE . Only take medications as instructed by your medical team. . Drink plenty of fluids and get plenty of rest. . A steam or ultrasonic humidifier can help if you have congestion.   GET HELP RIGHT AWAY IF YOU HAVE EMERGENCY WARNING SIGNS** FOR COVID-19. If you or someone is showing any of these signs seek emergency medical care immediately. Call 911 or proceed to your closest emergency facility if: . You develop worsening high fever. . Trouble breathing . Bluish lips or face . Persistent pain or pressure in the chest . New confusion . Inability to wake or stay awake . You cough up blood. . Your symptoms become more severe  **This list is not all possible symptoms. Contact your medical provider for any symptoms that are sever or concerning to you.  MAKE SURE YOU   Understand these instructions.  Will watch your condition.  Will get help right away if you are not doing well or get worse.  Your e-visit answers were  reviewed by a board certified advanced clinical practitioner to complete your personal care plan.  Depending on the condition, your plan could have included both over the counter or prescription medications.  If there is a problem please reply once you have received a response from your provider.  Your safety is important to [place].  If you have drug allergies check your prescription carefully.    You can use MyChart to ask questions about today's visit, request a non-urgent call back, or ask for a work or school excuse for 24 hours related to this e-Visit. If it has been greater than 24 hours you will need to follow up with your provider, or enter a new e-Visit to address those concerns. You will get an e-mail in the next two days asking about your experience.  I hope that your e-visit has been valuable and will speed your recovery. Thank you for using e-visits.   Approximately 5 minutes was used in reviewing the patient's chart, questionnaire, prescribing medications, and documentation.

## 2019-04-17 ENCOUNTER — Ambulatory Visit: Payer: BC Managed Care – PPO

## 2019-04-18 ENCOUNTER — Ambulatory Visit: Payer: BC Managed Care – PPO | Attending: Internal Medicine

## 2019-04-18 DIAGNOSIS — Z23 Encounter for immunization: Secondary | ICD-10-CM

## 2019-04-18 NOTE — Progress Notes (Signed)
   Covid-19 Vaccination Clinic  Name:  Adrian Johnson    MRN: [id number] DOB: [birth year]  04/18/2019  [name] was observed post Covid-19 immunization for 15 minutes without incident. He was provided with Vaccine Information Sheet and instruction to access the V-Safe system.   [name] was instructed to call 911 with any severe reactions post vaccine: Marland Kitchen Difficulty breathing  . Swelling of face and throat  . A fast heartbeat  . A bad rash all over body  . Dizziness and weakness   Immunizations Administered    Name Date Dose VIS Date Route   Pfizer COVID-19 Vaccine 04/18/2019 10:09 AM 0.3 mL 12/27/2018 Intramuscular   Manufacturer: [employer], [employer]   Lot: [id number]   NDC: [address]-4

## 2019-05-01 ENCOUNTER — Ambulatory Visit: Payer: BC Managed Care – PPO | Admitting: Pharmacist

## 2019-05-13 ENCOUNTER — Ambulatory Visit: Payer: BC Managed Care – PPO

## 2019-05-14 ENCOUNTER — Ambulatory Visit: Payer: BC Managed Care – PPO | Admitting: Pharmacist

## 2019-05-14 ENCOUNTER — Ambulatory Visit: Payer: BC Managed Care – PPO | Attending: Internal Medicine

## 2019-05-14 DIAGNOSIS — Z23 Encounter for immunization: Secondary | ICD-10-CM

## 2019-05-14 NOTE — Progress Notes (Signed)
   Covid-19 Vaccination Clinic  Name:  BOONE GEAR    MRN: [id number] DOB: [birth year]  05/14/2019  Mr. Rosko was observed post Covid-19 immunization for 15 minutes without incident. He was provided with Vaccine Information Sheet and instruction to access the V-Safe system.   Mr. Hlavacek was instructed to call 911 with any severe reactions post vaccine: Marland Kitchen Difficulty breathing  . Swelling of face and throat  . A fast heartbeat  . A bad rash all over body  . Dizziness and weakness   Immunizations Administered    Name Date Dose VIS Date Route   Pfizer COVID-19 Vaccine 05/14/2019  4:47 PM 0.3 mL 03/12/2018 Intramuscular   Manufacturer: [employer], [employer]   Lot: [id number]   NDC: [address]-9

## 2019-05-22 ENCOUNTER — Ambulatory Visit: Payer: BC Managed Care – PPO | Admitting: Pharmacist

## 2019-06-09 ENCOUNTER — Ambulatory Visit (INDEPENDENT_AMBULATORY_CARE_PROVIDER_SITE_OTHER): Payer: BC Managed Care – PPO | Admitting: Pharmacist

## 2019-06-09 ENCOUNTER — Other Ambulatory Visit: Payer: Self-pay

## 2019-06-09 ENCOUNTER — Other Ambulatory Visit (HOSPITAL_COMMUNITY)
Admission: RE | Admit: 2019-06-09 | Discharge: 2019-06-09 | Disposition: A | Discharge status: Home / Self Care | Payer: BC Managed Care – PPO | Source: Ambulatory Visit | Attending: Infectious Disease | Admitting: Infectious Disease

## 2019-06-09 DIAGNOSIS — Z79899 Other long term (current) drug therapy: Secondary | ICD-10-CM | POA: Diagnosis not present

## 2019-06-09 DIAGNOSIS — Z113 Encounter for screening for infections with a predominantly sexual mode of transmission: Secondary | ICD-10-CM

## 2019-06-09 NOTE — Progress Notes (Addendum)
I have reviewed the Infectious Disease Clinical Pharmacist's note above. I agree with the assessment and plan as outlined in Adrian Johnson's note.  Date:  06/09/2019   HPI: Adrian Johnson is a 29 y.o. male who presents to the [facility] pharmacy clinic for 3 month PrEP follow-up.  Insured   [x]    Uninsured  []    Patient Active Problem List   Diagnosis Date Noted  . High risk sexual behavior 07/21/2016    Patient's Medications  New Prescriptions   No medications on file  Previous Medications   ALBUTEROL (VENTOLIN HFA) 108 (90 BASE) MCG/ACT INHALER    Inhale 2 puffs into the lungs every 2 (two) hours as needed for wheezing or shortness of breath (cough).   AMOXICILLIN (AMOXIL) 500 MG CAPSULE    Take 1 capsule (500 mg total) by mouth 2 (two) times daily.   BENZONATATE (TESSALON) 100 MG CAPSULE    Take 1 capsule (100 mg total) by mouth 2 (two) times daily as needed for cough.   CETIRIZINE (ZYRTEC) 10 MG TABLET    Take 1 tablet (10 mg total) by mouth daily.   EMTRICITABINE-TENOFOVIR AF (DESCOVY) 200-25 MG TABLET    Take 1 tablet by mouth daily.   FLUTICASONE (FLONASE) 50 MCG/ACT NASAL SPRAY    Place 2 sprays into both nostrils daily.   FLUTICASONE (FLONASE) 50 MCG/ACT NASAL SPRAY    Place 2 sprays into both nostrils daily.   LORATADINE-PSEUDOEPHEDRINE (CLARITIN-D 24 HOUR) 10-240 MG 24 HR TABLET    Take 1 tablet by mouth daily.   PROMETHAZINE-DEXTROMETHORPHAN (PROMETHAZINE-DM) 6.25-15 MG/5ML SYRUP    Take 5 mLs by mouth 4 (four) times daily as needed for cough.  Modified Medications   No medications on file  Discontinued Medications   No medications on file    Allergies: No Known Allergies  Past Medical History: No past medical history on file.  Social History: Social History   Socioeconomic History  . Marital status: Single    Spouse name: Not on file  . Number of children: Not on file  . Years of education: Not on file  . Highest education level: Not on file    Occupational History  . Not on file  Tobacco Use  . Smoking status: Never Smoker  . Smokeless tobacco: Never Used  Substance and Sexual Activity  . Alcohol use: Yes  . Drug use: No  . Sexual activity: Not on file  Other Topics Concern  . Not on file  Social History Narrative  . Not on file   Social Determinants of Health   Financial Resource Strain:   . Difficulty of Paying Living Expenses:   Food Insecurity:   . Worried About 09/21/2016 in the Last Year:   . 03[birth year]1990 in the Last Year:   Transportation Needs:   . Programme researcher, broadcasting/film/video (Medical):   Barista Lack of Transportation (Non-Medical):   Physical Activity:   . Days of Exercise per Week:   . Minutes of Exercise per Session:   Stress:   . Feeling of Stress :   Social Connections:   . Frequency of Communication with Friends and Family:   . Frequency of Social Gatherings with Friends and Family:   . Attends Religious Services:   . Active Member of Clubs or Organizations:   . Attends Freight forwarder Meetings:   Marland Kitchen Marital Status:     CHL HIV PREP FLOWSHEET RESULTS 06/09/2019 11/08/2018 07/23/2017 04/26/2017 02/12/2017  11/20/2016 11/20/2016  Insurance Status Insured Insured Insured Insured Insured Insured Insured  How did you hear? - - Friends - - - -  Gender at birth Male Male Male Male Male Male Male  Gender identity cis-Male cis-Male cis-Male cis-Male cis-Male cis-Male cis-Male  Risk for HIV - - - - - Condomless vaginal or anal intercourse Condomless vaginal or anal intercourse  Risk for HIV - - - Condomless vaginal or anal intercourse Condomless vaginal or anal intercourse - -  Sex Partners Men only Men only Men only Men only Men only Men only Men only  # of sex partners - - - - - 2 2  # sex partners past 3-6 mos 1-3 1-3 1-3 1-3 - - -  Sex activity preferences Receptive;Oral Insertive and receptive;Oral Insertive and receptive;Oral Insertive and receptive;Oral - Insertive and receptive Insertive and  receptive;Oral  Condom use Yes Yes Yes Yes - Yes Yes  % condom use 90 100 100 46 - 14 50  Partners genders and ages - - 35 20-24;M 25-29;M 70-49 M 20-24;M 25-29 - M 20-24;M 25-29 M 20-24;M 25-29  Treated for STI? No No No No - - -  HIV symptoms? - N/A N/A Sore throat N/A - -  PrEP Eligibility - Substantial risk for HIV HIV negative;CrCl >60 ml/min;Substantial risk for HIV - - CrCl > 60 -  Paper work received? - - No - - - -    Labs:  SCr: Lab Results  Component Value Date   CREATININE 0.94 11/08/2018   CREATININE 0.76 07/23/2017   CREATININE 0.83 02/12/2017   CREATININE 0.85 08/18/2016   CREATININE 0.91 07/21/2016   HIV Lab Results  Component Value Date   HIV NON-REACTIVE 01/31/2019   HIV NON-REACTIVE 11/08/2018   HIV NON-REACTIVE 01/28/2018   HIV NON-REACTIVE 07/23/2017   HIV NON-REACTIVE 04/26/2017   Hepatitis B Lab Results  Component Value Date   HEPBSAB REACTIVE (A) 07/23/2017   HEPBSAG NEGATIVE 07/21/2016   Hepatitis C No results found for: HEPCAB, HCVRNAPCRQN Hepatitis A Lab Results  Component Value Date   HAV REACTIVE (A) 07/21/2016   RPR and STI Lab Results  Component Value Date   LABRPR NON-REACTIVE 11/08/2018   LABRPR NON-REACTIVE 01/28/2018   LABRPR NON-REACTIVE 04/26/2017   LABRPR NON REAC 07/21/2016   LABRPR Non Reactive 03/30/2016    STI Results GC GC CT CT  11/08/2018 Negative - Negative -  11/08/2018 Negative - Negative -  11/08/2018 Negative - Negative -  01/28/2018 Negative - Negative -  01/28/2018 Negative - Negative -  01/28/2018 Negative - Negative -  04/26/2017 Negative - Negative -  04/26/2017 Negative - Negative -  04/26/2017 Negative - Negative -  11/20/2016 Negative - Negative -  11/20/2016 Negative - Negative -  11/20/2016 Negative - Negative -  07/21/2016 Negative - Negative -  07/21/2016 Negative - Negative -  07/21/2016 Negative - Negative -  03/30/2016 - - Negative -  11/11/2015 - NOT DETECTED - NOT DETECTED  09/23/2015 - NOT DETECTED  - NOT DETECTED  12/17/2014 - NEGATIVE - NEGATIVE  12/31/2013 - NEGATIVE - NEGATIVE    Assessment: Adrian Johnson presents today for his 32-month PrEP follow-up appointment. He has been tolerating Descovy well with no issues and has not missed any doses. He has been feeling more fatigue lately, however he will continue to monitor. He has had the same 3 partners within the last 6 months and has been using condoms 90% of the time. He denies any symptoms of STDS.  Will get labs today.    Plan: - Labs: HIV ab, RPR, oral/urine/rectal cytology labs - Descovy x 3 months if HIV ab negative - F/u with Adrian Johnson in 3 months on 08/19 at 2:30PM  Fabio Neighbors, PharmD PGY1 Ambulatory Care Resident [facility] 06/09/2019, 4:20 PM

## 2019-06-10 LAB — RPR: RPR Ser Ql: NONREACTIVE

## 2019-06-10 LAB — HIV ANTIBODY (ROUTINE TESTING W REFLEX): HIV 1&2 Ab, 4th Generation: NONREACTIVE

## 2019-06-11 ENCOUNTER — Other Ambulatory Visit: Payer: Self-pay | Admitting: Pharmacist

## 2019-06-11 ENCOUNTER — Telehealth: Payer: Self-pay | Admitting: Pharmacist

## 2019-06-11 DIAGNOSIS — Z79899 Other long term (current) drug therapy: Secondary | ICD-10-CM

## 2019-06-11 DIAGNOSIS — Z7252 High risk homosexual behavior: Secondary | ICD-10-CM

## 2019-06-11 LAB — CYTOLOGY, (ORAL, ANAL, URETHRAL) ANCILLARY ONLY
Chlamydia: NEGATIVE
Chlamydia: NEGATIVE
Comment: NEGATIVE
Comment: NEGATIVE
Comment: NORMAL
Comment: NORMAL
Neisseria Gonorrhea: NEGATIVE
Neisseria Gonorrhea: NEGATIVE

## 2019-06-11 LAB — URINE CYTOLOGY ANCILLARY ONLY
Chlamydia: NEGATIVE
Comment: NEGATIVE
Comment: NORMAL
Neisseria Gonorrhea: NEGATIVE

## 2019-06-11 MED ORDER — DESCOVY 200-25 MG PO TABS: 1.0000 | ORAL_TABLET | Freq: Every day | ORAL | 2 refills | Status: DC

## 2019-06-11 NOTE — Progress Notes (Signed)
Patient's HIV antibody is negative.  Will send in 3 more months of Descovy to [facility].  

## 2019-06-11 NOTE — Telephone Encounter (Cosign Needed)
Informed Adrian Johnson that his HIV antibody and syphilis results are negative, and will follow-up with him if oral/rectal/urine cytologies for gonorrhea/chlamydia are abnormal. Will send Descovy x 3 months to [facility] on [address].  Adrian Johnson, PharmD PGY1 Ambulatory Care Resident

## 2019-06-23 ENCOUNTER — Telehealth: Payer: BC Managed Care – PPO | Admitting: Family

## 2019-06-23 DIAGNOSIS — R21 Rash and other nonspecific skin eruption: Secondary | ICD-10-CM | POA: Diagnosis not present

## 2019-06-23 MED ORDER — CEPHALEXIN 500 MG PO CAPS: 500.0000 mg | ORAL_CAPSULE | Freq: Three times a day (TID) | ORAL | 0 refills | Status: AC

## 2019-06-23 NOTE — Progress Notes (Signed)
E Visit for Rash  We are sorry that you are not feeling well. Here is how we plan to help!      Based upon what you have shared with me it looks like you have a bacterial follicultits.  Folliculitis is inflammation of the hair follicles that can be caused by a superficial infection of the skin and is treated with an antibiotic. I have prescribed: and Keflex 500 mg three times per day for 7 days      HOME CARE:   Take cool showers and avoid direct sunlight.  Apply cool compress or wet dressings.  Take a bath in an oatmeal bath.  Sprinkle content of one Aveeno packet under running faucet with comfortably warm water.  Bathe for 15-20 minutes, 1-2 times daily.  Pat dry with a towel. Do not rub the rash.  Use hydrocortisone cream.  Take an antihistamine like Benadryl for widespread rashes that itch.  The adult dose of Benadryl is 25-50 mg by mouth 4 times daily.  Caution:  This type of medication may cause sleepiness.  Do not drink alcohol, drive, or operate dangerous machinery while taking antihistamines.  Do not take these medications if you have prostate enlargement.  Read package instructions thoroughly on all medications that you take.  GET HELP RIGHT AWAY IF:   Symptoms don't go away after treatment.  Severe itching that persists.  If you rash spreads or swells.  If you rash begins to smell.  If it blisters and opens or develops a yellow-brown crust.  You develop a fever.  You have a sore throat.  You become short of breath.  MAKE SURE YOU:  Understand these instructions. Will watch your condition. Will get help right away if you are not doing well or get worse.  Thank you for choosing an e-visit. Your e-visit answers were reviewed by a board certified advanced clinical practitioner to complete your personal care plan. Depending upon the condition, your plan could have included both over the counter or prescription medications. Please review your pharmacy  choice. Be sure that the pharmacy you have chosen is open so that you can pick up your prescription now.  If there is a problem you may message your provider in MyChart to have the prescription routed to another pharmacy. Your safety is important to [place]. If you have drug allergies check your prescription carefully.  For the next 24 hours, you can use MyChart to ask questions about today's visit, request a non-urgent call back, or ask for a work or school excuse from your e-visit provider. You will get an email in the next two days asking about your experience. I hope that your e-visit has been valuable and will speed your recovery.   Approximately 5 minutes was spent documenting and reviewing patient's chart.

## 2019-07-28 ENCOUNTER — Encounter: Payer: Self-pay | Admitting: Pharmacist

## 2019-07-28 ENCOUNTER — Other Ambulatory Visit: Payer: Self-pay | Admitting: Pharmacist

## 2019-07-28 DIAGNOSIS — Z7252 High risk homosexual behavior: Secondary | ICD-10-CM

## 2019-07-28 DIAGNOSIS — Z79899 Other long term (current) drug therapy: Secondary | ICD-10-CM

## 2019-07-28 MED ORDER — DESCOVY 200-25 MG PO TABS: 1.0000 | ORAL_TABLET | Freq: Every day | ORAL | 1 refills | Status: DC

## 2019-07-28 NOTE — Progress Notes (Signed)
[facility] is having issues finding Rx. Resending refills. Patient has appt with me on 8/19.

## 2019-09-04 ENCOUNTER — Ambulatory Visit (INDEPENDENT_AMBULATORY_CARE_PROVIDER_SITE_OTHER): Payer: BC Managed Care – PPO | Admitting: Pharmacist

## 2019-09-04 ENCOUNTER — Other Ambulatory Visit: Payer: Self-pay

## 2019-09-04 DIAGNOSIS — Z79899 Other long term (current) drug therapy: Secondary | ICD-10-CM

## 2019-09-04 NOTE — Progress Notes (Signed)
Date:  09/04/2019   HPI: Adrian Johnson is a 29 y.o. male who presents to the [facility] pharmacy clinic for 3 month PrEP follow-up.  Insured   [x]    Uninsured  []    Patient Active Problem List   Diagnosis Date Noted  . High risk sexual behavior 07/21/2016    Patient's Medications  New Prescriptions   No medications on file  Previous Medications   ALBUTEROL (VENTOLIN HFA) 108 (90 BASE) MCG/ACT INHALER    Inhale 2 puffs into the lungs every 2 (two) hours as needed for wheezing or shortness of breath (cough).   AMOXICILLIN (AMOXIL) 500 MG CAPSULE    Take 1 capsule (500 mg total) by mouth 2 (two) times daily.   BENZONATATE (TESSALON) 100 MG CAPSULE    Take 1 capsule (100 mg total) by mouth 2 (two) times daily as needed for cough.   CETIRIZINE (ZYRTEC) 10 MG TABLET    Take 1 tablet (10 mg total) by mouth daily.   EMTRICITABINE-TENOFOVIR AF (DESCOVY) 200-25 MG TABLET    Take 1 tablet by mouth daily.   FLUTICASONE (FLONASE) 50 MCG/ACT NASAL SPRAY    Place 2 sprays into both nostrils daily.   FLUTICASONE (FLONASE) 50 MCG/ACT NASAL SPRAY    Place 2 sprays into both nostrils daily.   LORATADINE-PSEUDOEPHEDRINE (CLARITIN-D 24 HOUR) 10-240 MG 24 HR TABLET    Take 1 tablet by mouth daily.   PROMETHAZINE-DEXTROMETHORPHAN (PROMETHAZINE-DM) 6.25-15 MG/5ML SYRUP    Take 5 mLs by mouth 4 (four) times daily as needed for cough.  Modified Medications   No medications on file  Discontinued Medications   No medications on file    Allergies: No Known Allergies  Past Medical History: No past medical history on file.  Social History: Social History   Socioeconomic History  . Marital status: Single    Spouse name: Not on file  . Number of children: Not on file  . Years of education: Not on file  . Highest education level: Not on file  Occupational History  . Not on file  Tobacco Use  . Smoking status: Never Smoker  . Smokeless tobacco: Never Used  Substance and Sexual Activity  . Alcohol use:  Yes  . Drug use: No  . Sexual activity: Not on file  Other Topics Concern  . Not on file  Social History Narrative  . Not on file   Social Determinants of Health   Financial Resource Strain:   . Difficulty of Paying Living Expenses: Not on file  Food Insecurity:   . Worried About 09/21/2016 in the Last Year: Not on file  . Ran Out of Food in the Last Year: Not on file  Transportation Needs:   . Lack of Transportation (Medical): Not on file  . Lack of Transportation (Non-Medical): Not on file  Physical Activity:   . Days of Exercise per Week: Not on file  . Minutes of Exercise per Session: Not on file  Stress:   . Feeling of Stress : Not on file  Social Connections:   . Frequency of Communication with Friends and Family: Not on file  . Frequency of Social Gatherings with Friends and Family: Not on file  . Attends Religious Services: Not on file  . Active Member of Clubs or Organizations: Not on file  . Attends 03[birth year]20-1990 Meetings: Not on file  . Marital Status: Not on file    [facility] HIV PREP FLOWSHEET RESULTS 06/09/2019 11/08/2018 07/23/2017  04/26/2017 02/12/2017 11/20/2016 11/20/2016  Insurance Status Insured Insured Insured Insured Insured Insured Insured  How did you hear? - - Friends - - - -  Gender at birth Male Male Male Male Male Male Male  Gender identity cis-Male cis-Male cis-Male cis-Male cis-Male cis-Male cis-Male  Risk for HIV - - - - - Condomless vaginal or anal intercourse Condomless vaginal or anal intercourse  Risk for HIV - - - Condomless vaginal or anal intercourse Condomless vaginal or anal intercourse - -  Sex Partners Men only Men only Men only Men only Men only Men only Men only  # of sex partners - - - - - 2 2  # sex partners past 3-6 mos 1-3 1-3 1-3 1-3 - - -  Sex activity preferences Receptive;Oral Insertive and receptive;Oral Insertive and receptive;Oral Insertive and receptive;Oral - Insertive and receptive Insertive and receptive;Oral    Condom use Yes Yes Yes Yes - Yes Yes  % condom use 90 100 100 90 - 50 50  Partners genders and ages - - 67 20-24;M 25-29;M 65-49 M 20-24;M 25-29 - M 20-24;M 25-29 M 20-24;M 25-29  Treated for STI? No No No No - - -  HIV symptoms? - N/A N/A Sore throat N/A - -  PrEP Eligibility - Substantial risk for HIV HIV negative;CrCl >60 ml/min;Substantial risk for HIV - - CrCl > 60 -  Paper work received? - - No - - - -    Labs:  SCr: Lab Results  Component Value Date   CREATININE 0.94 11/08/2018   CREATININE 0.76 07/23/2017   CREATININE 0.83 02/12/2017   CREATININE 0.85 08/18/2016   CREATININE 0.91 07/21/2016   HIV Lab Results  Component Value Date   HIV NON-REACTIVE 06/09/2019   HIV NON-REACTIVE 01/31/2019   HIV NON-REACTIVE 11/08/2018   HIV NON-REACTIVE 01/28/2018   HIV NON-REACTIVE 07/23/2017   Hepatitis B Lab Results  Component Value Date   HEPBSAB REACTIVE (A) 07/23/2017   HEPBSAG NEGATIVE 07/21/2016   Hepatitis C No results found for: HEPCAB, HCVRNAPCRQN Hepatitis A Lab Results  Component Value Date   HAV REACTIVE (A) 07/21/2016   RPR and STI Lab Results  Component Value Date   LABRPR NON-REACTIVE 06/09/2019   LABRPR NON-REACTIVE 11/08/2018   LABRPR NON-REACTIVE 01/28/2018   LABRPR NON-REACTIVE 04/26/2017   LABRPR NON REAC 07/21/2016    STI Results GC GC CT CT  06/09/2019 Negative - Negative -  06/09/2019 Negative - Negative -  06/09/2019 Negative - Negative -  11/08/2018 Negative - Negative -  11/08/2018 Negative - Negative -  11/08/2018 Negative - Negative -  01/28/2018 Negative - Negative -  01/28/2018 Negative - Negative -  01/28/2018 Negative - Negative -  04/26/2017 Negative - Negative -  04/26/2017 Negative - Negative -  04/26/2017 Negative - Negative -  11/20/2016 Negative - Negative -  11/20/2016 Negative - Negative -  11/20/2016 Negative - Negative -  07/21/2016 Negative - Negative -  07/21/2016 Negative - Negative -  07/21/2016 Negative - Negative -   03/30/2016 - - Negative -  11/11/2015 - NOT DETECTED - NOT DETECTED    Assessment: Adrian Johnson is here today to follow up for PrEP.  He continues to do well on Descovy with no issues or side effects. He states that he may miss a dose every now and then but otherwise takes it daily. He takes it before work around 8-830am. He has not been very sexually active lately and trying to stay away from people due to  the COVID 19 pandemic. 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 He is finishing up a few prerequisite biology classes this semester and next semeseter. Will check labs today and see him back in 3 months.   Plan: - HIV antibody and BMET today - Descovy x 3 months if HIV negative - F/u in 3 months  Jiovani Mccammon L. Ein Rijo, PharmD, BCIDP, AAHIVP, CPP Clinical Pharmacist Practitioner Infectious Diseases Clinical Pharmacist [facility] 09/04/2019, 2:24 PM

## 2019-09-05 ENCOUNTER — Encounter: Payer: Self-pay | Admitting: Pharmacist

## 2019-09-05 ENCOUNTER — Other Ambulatory Visit: Payer: Self-pay | Admitting: Pharmacist

## 2019-09-05 DIAGNOSIS — Z7252 High risk homosexual behavior: Secondary | ICD-10-CM

## 2019-09-05 DIAGNOSIS — Z79899 Other long term (current) drug therapy: Secondary | ICD-10-CM

## 2019-09-05 LAB — BASIC METABOLIC PANEL
BUN: 11 mg/dL (ref 7–25)
CO2: 28 mmol/L (ref 20–32)
Calcium: 9.9 mg/dL (ref 8.6–10.3)
Chloride: 104 mmol/L (ref 98–110)
Creat: 0.84 mg/dL (ref 0.60–1.35)
Glucose, Bld: 118 mg/dL — ABNORMAL HIGH (ref 65–99)
Potassium: 4.1 mmol/L (ref 3.5–5.3)
Sodium: 139 mmol/L (ref 135–146)

## 2019-09-05 LAB — HIV ANTIBODY (ROUTINE TESTING W REFLEX): HIV 1&2 Ab, 4th Generation: NONREACTIVE

## 2019-09-05 MED ORDER — DESCOVY 200-25 MG PO TABS: 1.0000 | ORAL_TABLET | Freq: Every day | ORAL | 2 refills | Status: DC

## 2019-09-05 NOTE — Progress Notes (Signed)
Patient's HIV antibody is negative.  Will send in 3 more months of Descovy to [facility].  

## 2019-10-02 DIAGNOSIS — Z03818 Encounter for observation for suspected exposure to other biological agents ruled out: Secondary | ICD-10-CM | POA: Diagnosis not present

## 2019-10-10 DIAGNOSIS — F4323 Adjustment disorder with mixed anxiety and depressed mood: Secondary | ICD-10-CM | POA: Diagnosis not present

## 2019-10-31 DIAGNOSIS — F4323 Adjustment disorder with mixed anxiety and depressed mood: Secondary | ICD-10-CM | POA: Diagnosis not present

## 2019-11-07 DIAGNOSIS — F4323 Adjustment disorder with mixed anxiety and depressed mood: Secondary | ICD-10-CM | POA: Diagnosis not present

## 2019-12-02 DIAGNOSIS — Z0000 Encounter for general adult medical examination without abnormal findings: Secondary | ICD-10-CM | POA: Diagnosis not present

## 2019-12-02 DIAGNOSIS — Z23 Encounter for immunization: Secondary | ICD-10-CM | POA: Diagnosis not present

## 2019-12-02 DIAGNOSIS — Z13220 Encounter for screening for lipoid disorders: Secondary | ICD-10-CM | POA: Diagnosis not present

## 2019-12-05 DIAGNOSIS — F4323 Adjustment disorder with mixed anxiety and depressed mood: Secondary | ICD-10-CM | POA: Diagnosis not present

## 2019-12-18 ENCOUNTER — Ambulatory Visit (INDEPENDENT_AMBULATORY_CARE_PROVIDER_SITE_OTHER): Payer: BC Managed Care – PPO | Admitting: Pharmacist

## 2019-12-18 ENCOUNTER — Other Ambulatory Visit (HOSPITAL_COMMUNITY)
Admission: RE | Admit: 2019-12-18 | Discharge: 2019-12-18 | Disposition: A | Discharge status: Home / Self Care | Payer: BC Managed Care – PPO | Source: Ambulatory Visit | Attending: Infectious Disease | Admitting: Infectious Disease

## 2019-12-18 ENCOUNTER — Other Ambulatory Visit: Payer: Self-pay

## 2019-12-18 DIAGNOSIS — Z113 Encounter for screening for infections with a predominantly sexual mode of transmission: Secondary | ICD-10-CM

## 2019-12-18 DIAGNOSIS — Z79899 Other long term (current) drug therapy: Secondary | ICD-10-CM | POA: Diagnosis not present

## 2019-12-18 DIAGNOSIS — F411 Generalized anxiety disorder: Secondary | ICD-10-CM | POA: Diagnosis not present

## 2019-12-18 DIAGNOSIS — F331 Major depressive disorder, recurrent, moderate: Secondary | ICD-10-CM | POA: Diagnosis not present

## 2019-12-18 DIAGNOSIS — F4310 Post-traumatic stress disorder, unspecified: Secondary | ICD-10-CM | POA: Diagnosis not present

## 2019-12-18 DIAGNOSIS — F410 Panic disorder [episodic paroxysmal anxiety] without agoraphobia: Secondary | ICD-10-CM | POA: Diagnosis not present

## 2019-12-18 NOTE — Progress Notes (Signed)
Date:  12/18/2019   HPI: Adrian Johnson is a 29 y.o. male who presents to the [facility] pharmacy clinic for HIV PrEP follow-up.  Insured   [x]    Uninsured  []    Patient Active Problem List   Diagnosis Date Noted   High risk sexual behavior 07/21/2016    Patient's Medications  New Prescriptions   No medications on file  Previous Medications   ALBUTEROL (VENTOLIN HFA) 108 (90 BASE) MCG/ACT INHALER    Inhale 2 puffs into the lungs every 2 (two) hours as needed for wheezing or shortness of breath (cough).   AMOXICILLIN (AMOXIL) 500 MG CAPSULE    Take 1 capsule (500 mg total) by mouth 2 (two) times daily.   BENZONATATE (TESSALON) 100 MG CAPSULE    Take 1 capsule (100 mg total) by mouth 2 (two) times daily as needed for cough.   CETIRIZINE (ZYRTEC) 10 MG TABLET    Take 1 tablet (10 mg total) by mouth daily.   EMTRICITABINE-TENOFOVIR AF (DESCOVY) 200-25 MG TABLET    Take 1 tablet by mouth daily.   FLUTICASONE (FLONASE) 50 MCG/ACT NASAL SPRAY    Place 2 sprays into both nostrils daily.   FLUTICASONE (FLONASE) 50 MCG/ACT NASAL SPRAY    Place 2 sprays into both nostrils daily.   LORATADINE-PSEUDOEPHEDRINE (CLARITIN-D 24 HOUR) 10-240 MG 24 HR TABLET    Take 1 tablet by mouth daily.   PROMETHAZINE-DEXTROMETHORPHAN (PROMETHAZINE-DM) 6.25-15 MG/5ML SYRUP    Take 5 mLs by mouth 4 (four) times daily as needed for cough.  Modified Medications   No medications on file  Discontinued Medications   No medications on file    Allergies: No Known Allergies  Past Medical History: No past medical history on file.  Social History: Social History   Socioeconomic History   Marital status: Single    Spouse name: Not on file   Number of children: Not on file   Years of education: Not on file   Highest education level: Not on file  Occupational History   Not on file  Tobacco Use   Smoking status: Never Smoker   Smokeless tobacco: Never Used  Substance and Sexual Activity   Alcohol use: Yes    Drug use: No   Sexual activity: Not on file  Other Topics Concern   Not on file  Social History Narrative   Not on file   Social Determinants of Health   Financial Resource Strain:    Difficulty of Paying Living Expenses: Not on file  Food Insecurity:    Worried About Running Out of Food in the Last Year: Not on file   Ran Out of Food in the Last Year: Not on file  Transportation Needs:    Lack of Transportation (Medical): Not on file   Lack of Transportation (Non-Medical): Not on file  Physical Activity:    Days of Exercise per Week: Not on file   Minutes of Exercise per Session: Not on file  Stress:    Feeling of Stress : Not on file  Social Connections:    Frequency of Communication with Friends and Family: Not on file   Frequency of Social Gatherings with Friends and Family: Not on file   Attends Religious Services: Not on file   Active Member of Clubs or Organizations: Not on file   Attends 09/21/2016 Meetings: Not on file   Marital Status: Not on file    [facility] HIV PREP FLOWSHEET RESULTS 06/09/2019 11/08/2018 07/23/2017 04/26/2017  02/12/2017 11/20/2016 11/20/2016  Insurance Status Insured Insured Insured Insured Insured Insured Insured  How did you hear? - - Friends - - - -  Gender at birth Male Male Male Male Male Male Male  Gender identity cis-Male cis-Male cis-Male cis-Male cis-Male cis-Male cis-Male  Risk for HIV - - - - - Condomless vaginal or anal intercourse Condomless vaginal or anal intercourse  Risk for HIV - - - Condomless vaginal or anal intercourse Condomless vaginal or anal intercourse - -  Sex Partners Men only Men only Men only Men only Men only Men only Men only  # of sex partners - - - - - 2 2  # sex partners past 3-6 mos 1-3 1-3 1-3 1-3 - - -  Sex activity preferences Receptive;Oral Insertive and receptive;Oral Insertive and receptive;Oral Insertive and receptive;Oral - Insertive and receptive Insertive and receptive;Oral  Condom  use Yes Yes Yes Yes - Yes Yes  % condom use 90 100 100 90 - 50 50  Partners genders and ages - - 34 20-24;M 25-29;M 6-49 M 20-24;M 25-29 - M 20-24;M 25-29 M 20-24;M 25-29  Treated for STI? No No No No - - -  HIV symptoms? - N/A N/A Sore throat N/A - -  PrEP Eligibility - Substantial risk for HIV HIV negative;CrCl >60 ml/min;Substantial risk for HIV - - CrCl > 60 -  Paper work received? - - No - - - -    Labs:  SCr: Lab Results  Component Value Date   CREATININE 0.84 09/04/2019   CREATININE 0.94 11/08/2018   CREATININE 0.76 07/23/2017   CREATININE 0.83 02/12/2017   CREATININE 0.85 08/18/2016   HIV Lab Results  Component Value Date   HIV NON-REACTIVE 09/04/2019   HIV NON-REACTIVE 06/09/2019   HIV NON-REACTIVE 01/31/2019   HIV NON-REACTIVE 11/08/2018   HIV NON-REACTIVE 01/28/2018   Hepatitis B Lab Results  Component Value Date   HEPBSAB REACTIVE (A) 07/23/2017   HEPBSAG NEGATIVE 07/21/2016   Hepatitis C No results found for: HEPCAB, HCVRNAPCRQN Hepatitis A Lab Results  Component Value Date   HAV REACTIVE (A) 07/21/2016   RPR and STI Lab Results  Component Value Date   LABRPR NON-REACTIVE 06/09/2019   LABRPR NON-REACTIVE 11/08/2018   LABRPR NON-REACTIVE 01/28/2018   LABRPR NON-REACTIVE 04/26/2017   LABRPR NON REAC 07/21/2016    STI Results GC GC CT CT  06/09/2019 Negative - Negative -  06/09/2019 Negative - Negative -  06/09/2019 Negative - Negative -  11/08/2018 Negative - Negative -  11/08/2018 Negative - Negative -  11/08/2018 Negative - Negative -  01/28/2018 Negative - Negative -  01/28/2018 Negative - Negative -  01/28/2018 Negative - Negative -  04/26/2017 Negative - Negative -  04/26/2017 Negative - Negative -  04/26/2017 Negative - Negative -  11/20/2016 Negative - Negative -  11/20/2016 Negative - Negative -  11/20/2016 Negative - Negative -  07/21/2016 Negative - Negative -  07/21/2016 Negative - Negative -  07/21/2016 Negative - Negative -  03/30/2016 - -  Negative -  11/11/2015 - NOT DETECTED - NOT DETECTED    Assessment: Adrian Johnson is here for his 13-month PrEP follow up appointment. He is doing well on Descovy and taking it daily. He has not ran out and has no issues with adherence or side effects.  He has had one new partner since he last saw me in August and is requesting STI testing. No issues or concerns.   He has had all three of his  COVID vaccines. He has a [facility] PCP at [facility] and was recently started on hydroxyzine and lexapro. His medication list is updated. Will check labs and schedule him with Tammy Sours in 3 months. I will also schedule him to see me 3 months after that. All questions answered.   Plan: - HIV antibody, RPR, urine/oral/rectal cytologies - Descovy x 3 months if HIV negative - F/u with Tammy Sours 2/25 at 10:15am - F/u with me 6/1 at 10am  Kaylyne Axton L. Murna Backer, PharmD, BCIDP, AAHIVP, CPP Clinical Pharmacist Practitioner Infectious Diseases Clinical Pharmacist [facility] 12/18/2019, 11:29 AM

## 2019-12-19 LAB — URINE CYTOLOGY ANCILLARY ONLY
Chlamydia: NEGATIVE
Comment: NEGATIVE
Comment: NORMAL
Neisseria Gonorrhea: NEGATIVE

## 2019-12-19 LAB — CYTOLOGY, (ORAL, ANAL, URETHRAL) ANCILLARY ONLY
Chlamydia: NEGATIVE
Chlamydia: NEGATIVE
Comment: NEGATIVE
Comment: NEGATIVE
Comment: NORMAL
Comment: NORMAL
Neisseria Gonorrhea: NEGATIVE
Neisseria Gonorrhea: NEGATIVE

## 2019-12-19 LAB — HIV ANTIBODY (ROUTINE TESTING W REFLEX): HIV 1&2 Ab, 4th Generation: NONREACTIVE

## 2019-12-19 LAB — RPR: RPR Ser Ql: NONREACTIVE

## 2019-12-22 ENCOUNTER — Other Ambulatory Visit: Payer: Self-pay | Admitting: Pharmacist

## 2019-12-22 DIAGNOSIS — Z7252 High risk homosexual behavior: Secondary | ICD-10-CM

## 2019-12-22 DIAGNOSIS — Z79899 Other long term (current) drug therapy: Secondary | ICD-10-CM

## 2019-12-22 MED ORDER — DESCOVY 200-25 MG PO TABS: 1.0000 | ORAL_TABLET | Freq: Every day | ORAL | 2 refills | Status: DC

## 2019-12-22 NOTE — Progress Notes (Signed)
Patient's HIV antibody is negative.  Will send in 3 more months of Descovy to [facility].  

## 2020-02-18 ENCOUNTER — Other Ambulatory Visit (HOSPITAL_COMMUNITY): Payer: Self-pay | Admitting: Pharmacist

## 2020-02-18 ENCOUNTER — Ambulatory Visit (INDEPENDENT_AMBULATORY_CARE_PROVIDER_SITE_OTHER): Payer: BC Managed Care – PPO | Admitting: Family

## 2020-02-18 ENCOUNTER — Other Ambulatory Visit: Payer: Self-pay

## 2020-02-18 ENCOUNTER — Telehealth: Payer: Self-pay

## 2020-02-18 ENCOUNTER — Encounter: Payer: Self-pay | Admitting: Family

## 2020-02-18 VITALS — BP 120/76 | HR 66 | Wt 172.0 lb

## 2020-02-18 DIAGNOSIS — Z7252 High risk homosexual behavior: Secondary | ICD-10-CM

## 2020-02-18 DIAGNOSIS — Z79899 Other long term (current) drug therapy: Secondary | ICD-10-CM

## 2020-02-18 DIAGNOSIS — Z5181 Encounter for therapeutic drug level monitoring: Secondary | ICD-10-CM | POA: Diagnosis not present

## 2020-02-18 MED ORDER — DESCOVY 200-25 MG PO TABS: 1.0000 | ORAL_TABLET | Freq: Every day | ORAL | 0 refills | Status: DC

## 2020-02-18 MED ORDER — DESCOVY 200-25 MG PO TABS: 1.0000 | ORAL_TABLET | Freq: Every day | ORAL | 0 refills | Status: AC

## 2020-02-18 NOTE — Patient Instructions (Addendum)
Nice to see you.  We have gotten you approved for [employer] assistance to help obtaining your medication.  We will check your lab work and swabs today.   Plan for follow up in 3 months or sooner if needed.   Have a great day and a great trip to [place].   On demand (2-1-1) dosing schedule (for use in men who have sex with men only): Oral: Two tablets (emtricitabine 200 mg/tenofovir disoproxil fumarate 300 mg per tablet) taken 2 to 24 hours prior to sexual activity, 1 tablet 24 hours later, and 1 tablet 24 hours after that.

## 2020-02-18 NOTE — Assessment & Plan Note (Signed)
Mr. Reinoso continues to have good adherence and tolerance to his PrEP regimen of Descovy.  Check lab work and STI testing today.  Able to get him financial assistance to continue on Descovy as it appears his insurance is no longer active.  90-day prescription sent to pharmacy for his trip to [place].  Discussed importance of safe sexual practice to reduce risk of STI.  Condoms declined.  And for follow-up in 3 months or sooner if needed.

## 2020-02-18 NOTE — Telephone Encounter (Signed)
[facility] Patient Advocate Encounter  Completed and sent [employer] Advancing Access application for Descovy for this patient who is uninsured.    Patient is approved 02/18/20.    I also faxed a 90 day supply script to [facility].         Clearance Coots, CPhT Specialty Pharmacy Patient [facility] for Infectious Disease Phone: [phone] Fax:  [phone]

## 2020-02-18 NOTE — Progress Notes (Signed)
Subjective:    Patient ID: Adrian Johnson, male    DOB: May 24, 1990, 30 y.o.   MRN: [id number]  Chief Complaint  Patient presents with  . Follow-up     HPI:  Adrian Johnson is a 29 y.o. male on PrEP for HIV prophylaxis last seen on 12/18/2019 with good adherence and tolerance to Descovy.  Lab work was negative for HIV, syphilis, gonorrhea, and chlamydia.  Here today for routine follow-up.  Adrian Johnson continues to take his Descovy daily as prescribed with no adverse side effects or missed doses since his last office visit.  Overall feeling well today with no new concerns/complaints. Denies fevers, chills, night sweats, headaches, changes in vision, neck pain/stiffness, nausea, diarrhea, vomiting, lesions or rashes.  He is concerned as he may have an issue with his insurance.  He is planning on going out of the country to [place] for 3 months and requesting a 90-day supply.  No Known Allergies    Outpatient Medications Prior to Visit  Medication Sig Dispense Refill  . escitalopram (LEXAPRO) 10 MG tablet Take 10 mg by mouth daily.    . hydrOXYzine (ATARAX/VISTARIL) 10 MG tablet Take 10 mg by mouth 3 (three) times daily as needed.    Marland Kitchen emtricitabine-tenofovir AF (DESCOVY) 200-25 MG tablet Take 1 tablet by mouth daily. 30 tablet 2   No facility-administered medications prior to visit.     History reviewed. No pertinent past medical history.   Past Surgical History:  Procedure Laterality Date  . COSMETIC SURGERY      Review of Systems  Constitutional: Negative for appetite change, chills, fatigue, fever and unexpected weight change.  Eyes: Negative for visual disturbance.  Respiratory: Negative for cough, chest tightness, shortness of breath and wheezing.   Cardiovascular: Negative for chest pain and leg swelling.  Gastrointestinal: Negative for abdominal pain, constipation, diarrhea, nausea and vomiting.  Genitourinary: Negative for dysuria, flank pain, frequency, genital  sores, hematuria and urgency.  Skin: Negative for rash.  Allergic/Immunologic: Negative for immunocompromised state.  Neurological: Negative for dizziness and headaches.      Objective:    BP 120/76   Pulse 66   Wt 172 lb (78 kg)   BMI 23.99 kg/m  Nursing note and vital signs reviewed.  Physical Exam Constitutional:      General: He is not in acute distress.    Appearance: He is well-developed.  HENT:     Mouth/Throat:     Mouth: Oropharynx is clear and moist.  Eyes:     Conjunctiva/sclera: Conjunctivae normal.  Cardiovascular:     Rate and Rhythm: Normal rate and regular rhythm.     Pulses: Intact distal pulses.     Heart sounds: Normal heart sounds. No murmur heard. No friction rub. No gallop.   Pulmonary:     Effort: Pulmonary effort is normal. No respiratory distress.     Breath sounds: Normal breath sounds. No wheezing or rales.  Chest:     Chest wall: No tenderness.  Abdominal:     General: Bowel sounds are normal.     Palpations: Abdomen is soft.     Tenderness: There is no abdominal tenderness.  Musculoskeletal:     Cervical back: Neck supple.  Lymphadenopathy:     Cervical: No cervical adenopathy.  Skin:    General: Skin is warm and dry.     Findings: No rash.  Neurological:     Mental Status: He is alert and oriented to person, place, and time.  Psychiatric:        Mood and Affect: Mood and affect normal.        Behavior: Behavior normal.        Thought Content: Thought content normal.        Judgment: Judgment normal.      Depression screen [facility] 2/9 03/30/2016 11/11/2015 09/23/2015 12/17/2014  Decreased Interest 0 0 0 0  Down, Depressed, Hopeless 0 0 0 0  PHQ - 2 Score 0 0 0 0       Assessment & Plan:    Patient Active Problem List   Diagnosis Date Noted  . High risk sexual behavior 07/21/2016     Problem List Items Addressed This Visit      Other   High risk sexual behavior - Primary    Adrian Johnson continues to have good adherence and  tolerance to his PrEP regimen of Descovy.  Check lab work and STI testing today.  Able to get him financial assistance to continue on Descovy as it appears his insurance is no longer active.  90-day prescription sent to pharmacy for his trip to [place].  Discussed importance of safe sexual practice to reduce risk of STI.  Condoms declined.  And for follow-up in 3 months or sooner if needed.      Relevant Medications   emtricitabine-tenofovir AF (DESCOVY) 200-25 MG tablet   Other Relevant Orders   COMPLETE METABOLIC PANEL WITH GFR   HIV Antibody (routine testing w rflx)   Cytology (oral, anal, urethral) ancillary only   Cytology (oral, anal, urethral) ancillary only   Urine cytology ancillary only([place])    Other Visit Diagnoses    On pre-exposure prophylaxis for HIV       Relevant Medications   emtricitabine-tenofovir AF (DESCOVY) 200-25 MG tablet   Other Relevant Orders   COMPLETE METABOLIC PANEL WITH GFR   HIV Antibody (routine testing w rflx)   Cytology (oral, anal, urethral) ancillary only   Cytology (oral, anal, urethral) ancillary only   Urine cytology ancillary only([place])   Therapeutic drug monitoring           I am having Adrian Johnson maintain his hydrOXYzine, escitalopram, and Descovy.   Meds ordered this encounter  Medications  . DISCONTD: emtricitabine-tenofovir AF (DESCOVY) 200-25 MG tablet    Sig: Take 1 tablet by mouth daily.    Dispense:  90 tablet    Refill:  0    Order Specific Question:   Supervising Provider    Answer:   Judyann Munson [[id number]]  . emtricitabine-tenofovir AF (DESCOVY) 200-25 MG tablet    Sig: Take 1 tablet by mouth daily.    Dispense:  90 tablet    Refill:  0    Order Specific Question:   Supervising Provider    Answer:   Judyann Munson [[id number]]     Follow-up: Return in about 3 months (around 05/17/2020), or if symptoms worsen or fail to improve.   Marcos Eke, MSN, FNP-C Nurse Practitioner [facility] for  Infectious Disease [facility] Main number: [phone]

## 2020-02-19 LAB — CYTOLOGY, (ORAL, ANAL, URETHRAL) ANCILLARY ONLY
Chlamydia: NEGATIVE
Chlamydia: NEGATIVE
Comment: NEGATIVE
Comment: NEGATIVE
Comment: NORMAL
Comment: NORMAL
Neisseria Gonorrhea: NEGATIVE
Neisseria Gonorrhea: NEGATIVE

## 2020-02-19 LAB — COMPLETE METABOLIC PANEL WITH GFR
AG Ratio: 1.3 (calc) (ref 1.0–2.5)
ALT: 19 U/L (ref 9–46)
AST: 17 U/L (ref 10–40)
Albumin: 4.3 g/dL (ref 3.6–5.1)
Alkaline phosphatase (APISO): 33 U/L — ABNORMAL LOW (ref 36–130)
BUN: 9 mg/dL (ref 7–25)
CO2: 27 mmol/L (ref 20–32)
Calcium: 9.8 mg/dL (ref 8.6–10.3)
Chloride: 102 mmol/L (ref 98–110)
Creat: 0.77 mg/dL (ref 0.60–1.35)
GFR, Est African American: 142 mL/min/{1.73_m2} (ref >=60)
GFR, Est Non African American: 123 mL/min/{1.73_m2} (ref >=60)
Globulin: 3.2 g/dL (calc) (ref 1.9–3.7)
Glucose, Bld: 84 mg/dL (ref 65–99)
Potassium: 4.3 mmol/L (ref 3.5–5.3)
Sodium: 139 mmol/L (ref 135–146)
Total Bilirubin: 0.5 mg/dL (ref 0.2–1.2)
Total Protein: 7.5 g/dL (ref 6.1–8.1)

## 2020-02-19 LAB — HIV ANTIBODY (ROUTINE TESTING W REFLEX): HIV 1&2 Ab, 4th Generation: NONREACTIVE

## 2020-02-19 LAB — URINE CYTOLOGY ANCILLARY ONLY
Chlamydia: NEGATIVE
Comment: NEGATIVE
Comment: NORMAL
Neisseria Gonorrhea: NEGATIVE

## 2020-03-12 ENCOUNTER — Ambulatory Visit: Payer: BC Managed Care – PPO | Admitting: Family

## 2020-05-04 ENCOUNTER — Encounter: Payer: Self-pay | Admitting: Pharmacist

## 2020-05-18 ENCOUNTER — Other Ambulatory Visit: Payer: Self-pay | Admitting: Pharmacist

## 2020-05-18 DIAGNOSIS — Z79899 Other long term (current) drug therapy: Secondary | ICD-10-CM

## 2020-06-16 ENCOUNTER — Other Ambulatory Visit: Payer: BC Managed Care – PPO

## 2020-06-16 ENCOUNTER — Ambulatory Visit: Payer: BC Managed Care – PPO | Admitting: Pharmacist

## 2021-06-28 ENCOUNTER — Other Ambulatory Visit (HOSPITAL_COMMUNITY): Payer: Self-pay

## 2022-10-17 ENCOUNTER — Other Ambulatory Visit (HOSPITAL_COMMUNITY): Payer: Self-pay

## 2024-02-21 ENCOUNTER — Emergency Department (HOSPITAL_BASED_OUTPATIENT_CLINIC_OR_DEPARTMENT_OTHER): Admission: EM | Admit: 2024-02-21 | Discharge: 2024-02-21 | Payer: Self-pay
